# Patient Record
Sex: Male | Born: 1960 | Race: Black or African American | Hispanic: No | Marital: Married | Smoking: Former smoker
Health system: Southern US, Community
[De-identification: ages and names within clinical notes are randomized; demographics above are authoritative.]

## PROBLEM LIST (undated history)

## (undated) DIAGNOSIS — I10 Essential (primary) hypertension: Secondary | ICD-10-CM

## (undated) DIAGNOSIS — E785 Hyperlipidemia, unspecified: Secondary | ICD-10-CM

## (undated) HISTORY — DX: Essential (primary) hypertension: I10

## (undated) HISTORY — DX: Hyperlipidemia, unspecified: E78.5

---

## 2016-09-28 ENCOUNTER — Encounter: Payer: Self-pay | Admitting: Cardiology

## 2016-09-28 NOTE — Progress Notes (Signed)
Cardiology Office Note  Date: 09/29/2016   ID: Tim CampbellDanny L Macdonald, DOB [birth year], MRN [id number]  PCP: Kirstie PeriSHAH,ASHISH, MD  Referring provider: Orpah Cobbale Quirke, MD [facility])  Consulting Cardiologist: Nona DellSamuel Edra Riccardi, MD   Chief Complaint  Patient presents with  . Chest discomfort    History of Present Illness: Tim Macdonald is a 55 y.o. male referred for cardiology consultation after [facility]. He is a truck driver, [place], felt a feeling of jitteriness and and unease, also a streak-like tightness across his lower chest causing him to seek evaluation. Records indicate normal troponin I and d-dimer levels in the ER, no acute findings by ECG or chest x-ray. Some PVCs were noted on monitoring, otherwise no sustained arrhythmias. He was placed on PPI and told to follow-up with a cardiologist. He lives in [place].  He reports frequent reflux symptoms. Denies any regular use of energy drinks or other stimulants. He drinks coffee in the morning, had some before his usual trip the other day. Sometimes he feels a chest tightness at nighttime and a bitter taste in his throat.  Past medical history is outlined below. He has not undergone any prior ischemic testing. He states that he follows for regular DOT physicals.  Past Medical History:  Diagnosis Date  . Essential hypertension   . Hyperlipidemia     History reviewed. No pertinent surgical history.  Current Outpatient Prescriptions  Medication Sig Dispense Refill  . amLODipine (NORVASC) 10 MG tablet Take 10 mg by mouth daily.    Marland Kitchen. aspirin 81 MG chewable tablet Chew 81 mg by mouth daily.    Marland Kitchen. omeprazole (PRILOSEC) 20 MG capsule Take 20 mg by mouth daily.    . simvastatin (ZOCOR) 40 MG tablet Take 40 mg by mouth daily. Pt only taking 20 mg due to leg cramps    . terbinafine (LAMISIL) 250 MG tablet Take 250 mg by mouth daily.     No current facility-administered medications for this  visit.    Allergies:  Patient has no known allergies.   Social History: The patient  reports that he has been smoking E-cigarettes.  He started smoking about 32 years ago. He has never used smokeless tobacco. He reports that he does not drink alcohol or use drugs.   Family History: The patient's family history includes Heart disease in his father; Hypertension in his mother.   ROS:  Please see the history of present illness. Otherwise, complete review of systems is positive for none.  All other systems are reviewed and negative.   Physical Exam: VS:  BP 110/72 (BP Location: Right Arm)   Pulse 86   Ht 5\' 10"  (1.778 m)   Wt 260 lb (117.9 kg)   SpO2 96%   BMI 37.31 kg/m , BMI Body mass index is 37.31 kg/m.  Wt Readings from Last 3 Encounters:  09/29/16 260 lb (117.9 kg)    General: Obese male, appears comfortable at rest. HEENT: Conjunctiva and lids normal, oropharynx clear. Neck: Supple, no elevated JVP or carotid bruits, no thyromegaly. Lungs: Clear to auscultation, nonlabored breathing at rest. Cardiac: Regular rate and rhythm, no S3 or significant systolic murmur, no pericardial rub. Abdomen: Obese, nontender, bowel sounds present, no guarding or rebound. Extremities: Trace ankle edema, distal pulses 2+. Skin: Warm and dry. Musculoskeletal: No kyphosis. Neuropsychiatric: Alert and oriented x3, affect grossly appropriate.  Recent Labwork:  November 2017: Potassium 4.1, BUN 14, creatinine 1.3, AST 31, ALT  38, troponin I less than 0.012, d-dimer 0.38, hemoglobin 14.4, platelets 215  Assessment and Plan:  491. 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 Also reporting recurrent reflux symptoms. ECG nonspecific. He has not undergone any ischemic testing. We will arrange an exercise Myoview for further cardiac assessment  prior to clearing him to return to the road. Otherwise continue PPI.  2. Reflux symptoms, now on Prilosec. Keep follow-up with Dr. Sherryll BurgerShah.  3. Essential hypertension, on Norvasc, blood pressure well controlled today.  4. Hyperlipidemia, on Zocor.  Current medicines were reviewed with the patient today.   Orders Placed This Encounter  Procedures  . NM Myocar Multi W/Spect W/Wall Motion / EF    Disposition: Call with test results.  Signed, Jonelle SidleSamuel G. Abelino Tippin, MD, [facility] 09/29/2016 8:58 AM    [facility] at [facility] [address], [place], [place] [address] Phone: [phone]; Fax: [phone]

## 2016-09-29 ENCOUNTER — Ambulatory Visit (INDEPENDENT_AMBULATORY_CARE_PROVIDER_SITE_OTHER): Payer: 59 | Admitting: Cardiology

## 2016-09-29 ENCOUNTER — Encounter: Payer: Self-pay | Admitting: Cardiology

## 2016-09-29 VITALS — BP 110/72 | HR 86 | Ht 70.0 in | Wt 260.0 lb

## 2016-09-29 DIAGNOSIS — I10 Essential (primary) hypertension: Secondary | ICD-10-CM | POA: Diagnosis not present

## 2016-09-29 DIAGNOSIS — R0789 Other chest pain: Secondary | ICD-10-CM | POA: Diagnosis not present

## 2016-09-29 DIAGNOSIS — R072 Precordial pain: Secondary | ICD-10-CM

## 2016-09-29 DIAGNOSIS — K219 Gastro-esophageal reflux disease without esophagitis: Secondary | ICD-10-CM | POA: Diagnosis not present

## 2016-09-29 DIAGNOSIS — E782 Mixed hyperlipidemia: Secondary | ICD-10-CM

## 2016-09-29 NOTE — Patient Instructions (Signed)
Your physician recommends that you schedule a follow-up appointment in:  To be determined     Your physician has requested that you have en exercise stress myoview. For further information please visit [email or web address]. Please follow instruction sheet, as given.      Thank you for choosing [facility] !

## 2016-09-30 ENCOUNTER — Encounter (HOSPITAL_COMMUNITY): Payer: Self-pay

## 2016-09-30 ENCOUNTER — Inpatient Hospital Stay (HOSPITAL_COMMUNITY): Admission: RE | Admit: 2016-09-30 | Payer: 59 | Source: Ambulatory Visit

## 2016-09-30 ENCOUNTER — Encounter (HOSPITAL_COMMUNITY)
Admission: RE | Admit: 2016-09-30 | Discharge: 2016-09-30 | Disposition: A | Discharge status: Home / Self Care | Payer: 59 | Source: Ambulatory Visit | Attending: Cardiology | Admitting: Cardiology

## 2016-09-30 ENCOUNTER — Encounter (HOSPITAL_BASED_OUTPATIENT_CLINIC_OR_DEPARTMENT_OTHER)
Admission: RE | Admit: 2016-09-30 | Discharge: 2016-09-30 | Disposition: A | Discharge status: Home / Self Care | Payer: 59 | Source: Ambulatory Visit | Attending: Cardiology | Admitting: Cardiology

## 2016-09-30 ENCOUNTER — Ambulatory Visit (HOSPITAL_COMMUNITY)
Admission: RE | Admit: 2016-09-30 | Discharge: 2016-09-30 | Disposition: A | Discharge status: Home / Self Care | Payer: 59 | Source: Ambulatory Visit | Attending: Cardiology | Admitting: Cardiology

## 2016-09-30 DIAGNOSIS — R072 Precordial pain: Secondary | ICD-10-CM

## 2016-09-30 LAB — NM MYOCAR MULTI W/SPECT W/WALL MOTION / EF
CHL CUP NUCLEAR SRS: 5
CHL CUP NUCLEAR SSS: 6
CHL CUP RESTING HR STRESS: 75 {beats}/min
CHL RATE OF PERCEIVED EXERTION: 13
CSEPED: 6 min
CSEPEW: 7 METS
Exercise duration (sec): 0 s
LV dias vol: 64 mL (ref 62–150)
LV sys vol: 25 mL
MPHR: 167 {beats}/min
Peak HR: 155 {beats}/min
Percent HR: 92 %
RATE: 0.3
SDS: 1
TID: 0.79

## 2016-09-30 MED ORDER — SODIUM CHLORIDE 0.9% FLUSH
INTRAVENOUS | Status: AC
  Administered 2016-09-30: 10 mL via INTRAVENOUS
  Filled 2016-09-30: qty 10

## 2016-09-30 MED ORDER — REGADENOSON 0.4 MG/5ML IV SOLN
INTRAVENOUS | Status: AC
  Filled 2016-09-30: qty 5

## 2016-09-30 MED ORDER — TECHNETIUM TC 99M TETROFOSMIN IV KIT
30.0000 | PACK | Freq: Once | INTRAVENOUS | Status: AC | PRN
  Administered 2016-09-30: 30 via INTRAVENOUS

## 2016-09-30 MED ORDER — TECHNETIUM TC 99M TETROFOSMIN IV KIT
10.0000 | PACK | Freq: Once | INTRAVENOUS | Status: AC | PRN
  Administered 2016-09-30: 10.1 via INTRAVENOUS

## 2016-10-04 ENCOUNTER — Telehealth: Payer: Self-pay

## 2016-10-04 NOTE — Telephone Encounter (Signed)
-----   Message from Jonelle SidleSamuel G McDowell, MD sent at 09/30/2016  4:42 PM EST ----- Results reviewed. Stress test was low risk without ischemia. No further cardiac workup anticipated at this time. 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 Keep follow-up with Dr. Sherryll BurgerShah. A copy of this test should be forwarded to [facility], MD.

## 2016-10-04 NOTE — Telephone Encounter (Signed)
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

## 2020-10-14 ENCOUNTER — Encounter: Payer: Self-pay | Admitting: Internal Medicine

## 2020-10-14 ENCOUNTER — Other Ambulatory Visit: Payer: Self-pay

## 2020-10-14 ENCOUNTER — Ambulatory Visit (HOSPITAL_COMMUNITY)
Admission: RE | Admit: 2020-10-14 | Discharge: 2020-10-14 | Disposition: A | Discharge status: Home / Self Care | Payer: BC Managed Care – PPO | Source: Ambulatory Visit | Attending: Internal Medicine | Admitting: Internal Medicine

## 2020-10-14 ENCOUNTER — Ambulatory Visit (INDEPENDENT_AMBULATORY_CARE_PROVIDER_SITE_OTHER): Payer: BC Managed Care – PPO | Admitting: Internal Medicine

## 2020-10-14 ENCOUNTER — Other Ambulatory Visit (HOSPITAL_COMMUNITY)
Admission: RE | Admit: 2020-10-14 | Discharge: 2020-10-14 | Disposition: A | Discharge status: Home / Self Care | Payer: BC Managed Care – PPO | Source: Ambulatory Visit | Attending: Internal Medicine | Admitting: Internal Medicine

## 2020-10-14 DIAGNOSIS — I10 Essential (primary) hypertension: Secondary | ICD-10-CM | POA: Insufficient documentation

## 2020-10-14 DIAGNOSIS — J8410 Pulmonary fibrosis, unspecified: Secondary | ICD-10-CM | POA: Insufficient documentation

## 2020-10-14 DIAGNOSIS — R0609 Other forms of dyspnea: Secondary | ICD-10-CM

## 2020-10-14 DIAGNOSIS — J984 Other disorders of lung: Secondary | ICD-10-CM | POA: Insufficient documentation

## 2020-10-14 DIAGNOSIS — R0600 Dyspnea, unspecified: Secondary | ICD-10-CM | POA: Insufficient documentation

## 2020-10-14 LAB — CBC WITH DIFFERENTIAL/PLATELET
Abs Immature Granulocytes: 0.02 10*3/uL (ref 0.00–0.07)
Basophils Absolute: 0.1 10*3/uL (ref 0.0–0.1)
Basophils Relative: 1 %
Eosinophils Absolute: 0.3 10*3/uL (ref 0.0–0.5)
Eosinophils Relative: 5 %
HCT: 47.1 % (ref 39.0–52.0)
Hemoglobin: 14.9 g/dL (ref 13.0–17.0)
Immature Granulocytes: 0 %
Lymphocytes Relative: 35 %
Lymphs Abs: 2 10*3/uL (ref 0.7–4.0)
MCH: 27 pg (ref 26.0–34.0)
MCHC: 31.6 g/dL (ref 30.0–36.0)
MCV: 85.3 fL (ref 80.0–100.0)
Monocytes Absolute: 0.5 10*3/uL (ref 0.1–1.0)
Monocytes Relative: 9 %
Neutro Abs: 2.9 10*3/uL (ref 1.7–7.7)
Neutrophils Relative %: 50 %
Platelets: 174 10*3/uL (ref 150–400)
RBC: 5.52 MIL/uL (ref 4.22–5.81)
RDW: 13.5 % (ref 11.5–15.5)
WBC: 5.8 10*3/uL (ref 4.0–10.5)
nRBC: 0 % (ref 0.0–0.2)

## 2020-10-14 LAB — BASIC METABOLIC PANEL
Anion gap: 9 (ref 5–15)
BUN: 14 mg/dL (ref 6–20)
CO2: 27 mmol/L (ref 22–32)
Calcium: 9.4 mg/dL (ref 8.9–10.3)
Chloride: 100 mmol/L (ref 98–111)
Creatinine, Ser: 1.31 mg/dL — ABNORMAL HIGH (ref 0.61–1.24)
GFR, Estimated: >60 mL/min (ref >60)
Glucose, Bld: 104 mg/dL — ABNORMAL HIGH (ref 70–99)
Potassium: 3.8 mmol/L (ref 3.5–5.1)
Sodium: 136 mmol/L (ref 135–145)

## 2020-10-14 LAB — BRAIN NATRIURETIC PEPTIDE: B Natriuretic Peptide: 35 pg/mL (ref 0.0–100.0)

## 2020-10-14 LAB — SEDIMENTATION RATE: Sed Rate: 12 mm/hr (ref 0–16)

## 2020-10-14 LAB — D-DIMER, QUANTITATIVE: D-Dimer, Quant: 1.06 ug/mL-FEU — ABNORMAL HIGH (ref 0.00–0.50)

## 2020-10-14 LAB — TSH: TSH: 1.274 u[IU]/mL (ref 0.350–4.500)

## 2020-10-14 LAB — CK: Total CK: 138 U/L (ref 49–397)

## 2020-10-14 MED ORDER — VALSARTAN 80 MG PO TABS: 80.0000 mg | ORAL_TABLET | Freq: Every day | ORAL | 11 refills | Status: DC

## 2020-10-14 NOTE — Assessment & Plan Note (Addendum)
Try off acei 10/14/2020 for atypical sob with dry cough persistent since covid 05/2020   In the best review of chronic cough to date ( NEJM 2016 375 [id number]) ,  ACEi are now felt to cause cough in up to  20% of pts which is a 4 fold increase from previous reports and does not include the variety of non-specific complaints we see in pulmonary clinic in pts on ACEi but previously attributed to another dx like  Copd/asthma and  include PNDS, throat and chest congestion, "bronchitis", unexplained dyspnea and noct "strangling" sensations, and hoarseness, but also  atypical /refractory GERD symptoms like dysphagia and "bad heartburn"   The only way I know  to prove this is not an "ACEi Case" is a trial off ACEi x a minimum of 6 weeks then regroup.  >>> try diovan 80 mg daily and f/u in 6 weeks          Each maintenance medication was reviewed in detail including emphasizing most importantly the difference between maintenance and prns and under what circumstances the prns are to be triggered using an action plan format where appropriate.  Total time for H and P, chart review, counseling,   directly observing portions of ambulatory 02 saturation study/  and generating customized AVS unique to this office visit / charting = 49 min

## 2020-10-14 NOTE — Progress Notes (Signed)
Tim Macdonald, male    DOB: 1960-12-28      MRN: [id number]   Brief patient profile:  59 yobm quit smoking 05/2020 with onset of covid with background of hbp, high cholesterol and gerd but nl ex tolerance then admitted to [facility] 8/23-31/21 and able to walk 50 ft and sob and has not changed a bit since then so referred to pulmonary clinic in [facility]  10/14/2020 by Dr  Tim Macdonald.    Cardiac w/u in [place] "everything was fine"  History of Present Illness  10/14/2020  Pulmonary/ 1st office eval/ Tim Macdonald / [facility] Office  Chief Complaint  Patient presents with  . 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   Sleeps as above without nocturnal  or early am exacerbation  of respiratory  c/o's or need for noct saba. Also denies any obvious fluctuation of symptoms with weather or environmental changes or other aggravating or alleviating factors except as outlined above   No unusual exposure hx or h/o childhood pna/ asthma or knowledge of premature birth.  Current Allergies, Complete Past Medical History, Past Surgical History, Family History, and Social History were reviewed in Tim Macdonald record.  ROS  The following are not active complaints unless bolded Hoarseness, sore throat, dysphagia, dental problems, itching, sneezing,  nasal congestion or discharge of excess mucus or purulent secretions, ear ache,   fever, chills, sweats, unintended wt loss or wt gain, classically pleuritic or exertional cp,  orthopnea pnd or arm/hand swelling  or  leg swelling, presyncope, palpitations, abdominal pain, anorexia, nausea, vomiting, diarrhea  or change in bowel habits or change in bladder habits, change in stools or change in urine, dysuria, hematuria,  rash, arthralgias myalgias, visual complaints, headache, numbness, weakness or ataxia or problems with walking or coordination,  change in mood or  memory.           Past Medical History:  Diagnosis Date  . Essential hypertension   . Hyperlipidemia     Outpatient Medications Prior to Visit  Medication Sig Dispense Refill  . amLODipine (NORVASC) 10 MG tablet Take 10 mg by mouth daily.    Marland Kitchen aspirin 81 MG chewable tablet Chew 81 mg by mouth daily.    Marland Kitchen lisinopril (ZESTRIL) 2.5 MG tablet Take 2.5 mg by mouth daily.    . simvastatin (ZOCOR) 40 MG tablet Take 40 mg by mouth daily. Pt only taking 20 mg due to leg cramps    . omeprazole (PRILOSEC) 20 MG capsule Take 20 mg by mouth daily.    Marland Kitchen terbinafine (LAMISIL) 250 MG tablet Take 250 mg by mouth daily.     No facility-administered medications prior to visit.     Objective:     BP 128/90 (BP Location: Left Arm, Cuff Size: Normal)   Pulse (!) 59   Temp (!) 97.2 F (36.2 C) (Temporal)   Ht 5\' 10"  (1.778 m)   Wt 245 lb (111.1 kg)   SpO2 96% Comment: on RA  BMI 35.15  kg/m   SpO2: 96 % (on RA)   Soft spoken pleasant amb bm nad    HEENT : pt wearing mask not removed for exam due to covid -19 concerns.    NECK :  without JVD/Nodes/TM/ nl carotid upstrokes bilaterally   LUNGS: no acc muscle use,  Nl contour chest which is clear to A and P bilaterally without cough on insp or exp maneuvers   CV:  RRR  no s3 or murmur or increase in P2, and no edema   ABD:  soft and nontender with nl inspiratory excursion in the supine position. No bruits or organomegaly appreciated, bowel sounds nl  MS:  Nl gait/ ext warm without deformities, calf tenderness, cyanosis or clubbing No obvious joint restrictions   SKIN: warm and dry without  lesions    NEURO:  alert, approp, nl sensorium with  no motor or cerebellar deficits apparent.     I personally reviewed images and agree with radiology impression as follows:   Chest CT  1. Spectrum of findings compatible with basilar predominant fibrotic  interstitial lung disease without frank honeycombing. Compared to  06/23/2020 chest CT study, the consolidative opacities have  resolved, the ground-glass opacities have improved, and the  reticulation and traction bronchiectasis is worsened. 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 Findings  are suggestive of an alternative diagnosis (not UIP) per consensus  guidelines: Diagnosis of Idiopathic Pulmonary Fibrosis: An Official  ATS/ERS/JRS/ALAT Clinical Practice Guideline. Am Rosezetta Schlatter Crit Care  Med Vol 198, Iss 5, [id number], Jul 01 2017.  2. Stable nonspecific mild mediastinal and right hilar  lymphadenopathy, most likely representing benign reactive  adenopathy.  3. Moderate emphysema with mild diffuse bronchial wall thickening,  suggesting COPD.  4. Emphysema (ICD10-J43.9).    CXR PA and Lateral:   10/14/2020 :    I personally reviewed images and   impression as follows:  Minimal increase markings non-specific  Labs ordered/ reviewed:      Chemistry      Component Value Date/Time   NA 136 10/14/2020 1107   K 3.8 10/14/2020 1107   CL 100 10/14/2020 1107   CO2 27 10/14/2020 1107   BUN 14 10/14/2020 1107   CREATININE 1.31 (H) 10/14/2020 1107      Component Value Date/Time   CALCIUM 9.4 10/14/2020 1107        Lab Results  Component Value Date   WBC 5.8 10/14/2020   HGB 14.9 10/14/2020   HCT 47.1 10/14/2020   MCV 85.3 10/14/2020   PLT 174 10/14/2020      EOS                                                                0.3                                     10/14/2020   Lab Results  Component Value Date   DDIMER 1.06 (H) 10/14/2020      Lab Results  Component Value Date    TSH 1.274 10/14/2020     BNP   10/14/2020     =    35 CPK   10/14/2020     =  138    Lab Results  Component Value Date   ESRSEDRATE 12 10/14/2020                Assessment   No problem-specific Assessment & Plan notes found for this encounter.     Sandrea Hughs, MD 10/14/2020

## 2020-10-14 NOTE — Assessment & Plan Note (Signed)
Onset with covid infection 05/2020 req admit/ transient 02 -  10/14/2020   Walked RA  approx   300 ft  @ avg pace  stopped due to  [employer] with sats 99% and aches/weakness in legs on zocor 40 mg daily   - d/c acei 10/14/2020    Symptoms are markedly disproportionate to objective findings and not clear to what extent this is actually a pulmonary  problem but pt does appear to have difficult to sort out respiratory symptoms of unknown origin for which  DDX  = almost all start with A and  include Adherence, Ace Inhibitors, Acid Reflux, Active Sinus Disease, Alpha 1 Antitripsin deficiency, Anxiety masquerading as Airways dz,  ABPA,  Allergy(esp in young), Aspiration (esp in elderly), Adverse effects of meds,  Active smoking or Vaping, A bunch of PE's/clot burden (a few small clots can't cause this syndrome unless there is already severe underlying pulm or vascular dz with poor reserve),  Anemia or thyroid disorder, plus two Bs  = Bronchiectasis and Beta blocker use..and one C= CHF     Adherence is always the initial "prime suspect" and is a multilayered concern that requires a "trust but verify" approach in every patient - starting with knowing how to use medications, especially inhalers, correctly, keeping up with refills and understanding the fundamental difference between maintenance and prns vs those medications only taken for a very short course and then stopped and not refilled.   ACEi adverse effects at the  top of the usual list of suspects and the only way to rule it out is a trial off > see a/p    ? Adverse drug effects ? zocor myopathy > no elevation in CPK so just try one half dose until returns  ? Acid (or non-acid) GERD > always difficult to exclude as up to 75% of pts in some series report no assoc GI/ Heartburn symptoms> rec max (24h)  acid suppression and diet restrictions/ reviewed and instructions given in writing.  ? Asthma/ allergy > eos 0.3 but no other evidence  ? Anemia/ thyroid  disorder > ruled out  ? ? Anxiety/depression/ deconditioning  > usually at the bottom of this list of usual suspects but should be much higher on this pt's  and may interfere with adherence and also interpretation of response or lack thereof to symptom management which can be quite subjective.  >> reconditioning reviewed in detail  ? A bunch of PEs  > D dimer  high normal value (seen commonly in the elderly or chronically ill or pts during nl recuperating from covid)  may miss small peripheral pe, the clot burden with sob is moderately high and the d dimer  has a very high neg pred value if used in this setting.   ? chf > excluded by BNP/ note cards w/u already neg per pt but c/w palpitations should be directed back to them

## 2020-10-14 NOTE — Patient Instructions (Addendum)
We will walk you today to check your 02 sats  Make sure you check your oxygen saturation at your highest level of activity to be sure it stays over 90% and keep track of it at least once a week, more often if breathing getting worse, and let me know if losing ground.   Stop lisinopril and only take one half of your simvastatin daily (zocoor)  Start diovan (valsartan) 80 mg one daily and your breathing and coughing should improve over the next few weeks   To get the most out of exercise, you need to be continuously aware that you are short of breath, but never out of breath, for 30 minutes twice daily. As you improve, it will actually be easier for you to do the same amount of exercise  in  30 minutes so always push to the level where you are short of breath.    Keep taking the taking the medications for reflux until 100% better  GERD (REFLUX)  is an extremely common cause of respiratory symptoms just like yours , many times with no obvious heartburn at all.    It can be treated with medication, but also with lifestyle changes including elevation of the head of your bed (ideally with 6 -8inch blocks under the headboard of your bed),  Smoking cessation, avoidance of late meals, excessive alcohol, and avoid fatty foods, chocolate, peppermint, colas, red wine, and acidic juices such as orange juice.  NO MINT OR MENTHOL PRODUCTS SO NO COUGH DROPS  USE SUGARLESS CANDY INSTEAD (Jolley ranchers or Stover's or Life Savers) or even ice chips will also do - the key is to swallow to prevent all throat clearing. NO OIL BASED VITAMINS - use powdered substitutes.  Avoid fish oil when coughing.      I very strongly recommend you get the moderna or pfizer vaccine as soon as possible based on your risk of dying from the virus  and the proven safety and benefit of these vaccines against even the omicron  variant.  This can save your life as well as  those of your loved ones,  especially if they are also not  vaccinated.    Please remember to go to the lab and x-ray department at [facility]   for your tests - we will call you with the results when they are available.   Please schedule a follow up office visit in 4 weeks, sooner if needed with pfts in meantime

## 2020-10-21 ENCOUNTER — Telehealth: Payer: Self-pay | Admitting: Internal Medicine

## 2020-10-21 NOTE — Telephone Encounter (Signed)
Call patient : Studies are unremarkable, the d dimer is as expected   I have called and LM on VM for pt to call back about these results.

## 2020-10-21 NOTE — Telephone Encounter (Signed)
Pt returning missed call. [phone] 

## 2020-10-21 NOTE — Telephone Encounter (Signed)
I have called and spoke with pt and he is aware of lab results.  appt scheduled with MW in [place] for 11/13/2020.

## 2020-10-21 NOTE — Telephone Encounter (Signed)
Pt returning missed call. [phone]

## 2020-11-07 ENCOUNTER — Other Ambulatory Visit: Payer: Self-pay

## 2020-11-07 ENCOUNTER — Emergency Department (HOSPITAL_COMMUNITY)
Admit: 2020-11-07 | Discharge: 2020-11-07 | Disposition: A | Discharge status: Home / Self Care | Payer: BC Managed Care – PPO | Attending: Emergency Medicine | Admitting: Emergency Medicine

## 2020-11-07 ENCOUNTER — Emergency Department (HOSPITAL_COMMUNITY)
Admission: EM | Admit: 2020-11-07 | Discharge: 2020-11-07 | Disposition: A | Discharge status: Home / Self Care | Payer: BC Managed Care – PPO | Attending: Emergency Medicine | Admitting: Emergency Medicine

## 2020-11-07 ENCOUNTER — Emergency Department (HOSPITAL_COMMUNITY): Payer: BC Managed Care – PPO

## 2020-11-07 ENCOUNTER — Encounter (HOSPITAL_COMMUNITY): Payer: Self-pay | Admitting: Emergency Medicine

## 2020-11-07 DIAGNOSIS — I10 Essential (primary) hypertension: Secondary | ICD-10-CM | POA: Diagnosis not present

## 2020-11-07 DIAGNOSIS — Z87891 Personal history of nicotine dependence: Secondary | ICD-10-CM | POA: Diagnosis not present

## 2020-11-07 DIAGNOSIS — R202 Paresthesia of skin: Secondary | ICD-10-CM | POA: Diagnosis present

## 2020-11-07 DIAGNOSIS — Z79899 Other long term (current) drug therapy: Secondary | ICD-10-CM | POA: Diagnosis not present

## 2020-11-07 DIAGNOSIS — Z7982 Long term (current) use of aspirin: Secondary | ICD-10-CM | POA: Diagnosis not present

## 2020-11-07 DIAGNOSIS — Z20822 Contact with and (suspected) exposure to covid-19: Secondary | ICD-10-CM | POA: Insufficient documentation

## 2020-11-07 DIAGNOSIS — R200 Anesthesia of skin: Secondary | ICD-10-CM

## 2020-11-07 LAB — CBC WITH DIFFERENTIAL/PLATELET
Abs Immature Granulocytes: 0.01 10*3/uL (ref 0.00–0.07)
Basophils Absolute: 0.1 10*3/uL (ref 0.0–0.1)
Basophils Relative: 2 %
Eosinophils Absolute: 0.3 10*3/uL (ref 0.0–0.5)
Eosinophils Relative: 8 %
HCT: 45.1 % (ref 39.0–52.0)
Hemoglobin: 14.4 g/dL (ref 13.0–17.0)
Immature Granulocytes: 0 %
Lymphocytes Relative: 43 %
Lymphs Abs: 1.8 10*3/uL (ref 0.7–4.0)
MCH: 27.1 pg (ref 26.0–34.0)
MCHC: 31.9 g/dL (ref 30.0–36.0)
MCV: 84.9 fL (ref 80.0–100.0)
Monocytes Absolute: 0.5 10*3/uL (ref 0.1–1.0)
Monocytes Relative: 13 %
Neutro Abs: 1.4 10*3/uL — ABNORMAL LOW (ref 1.7–7.7)
Neutrophils Relative %: 34 %
Platelets: 190 10*3/uL (ref 150–400)
RBC: 5.31 MIL/uL (ref 4.22–5.81)
RDW: 13.6 % (ref 11.5–15.5)
WBC: 4.1 10*3/uL (ref 4.0–10.5)
nRBC: 0 % (ref 0.0–0.2)

## 2020-11-07 LAB — COMPREHENSIVE METABOLIC PANEL
ALT: 17 U/L (ref 0–44)
AST: 19 U/L (ref 15–41)
Albumin: 4 g/dL (ref 3.5–5.0)
Alkaline Phosphatase: 53 U/L (ref 38–126)
Anion gap: 9 (ref 5–15)
BUN: 13 mg/dL (ref 6–20)
CO2: 27 mmol/L (ref 22–32)
Calcium: 9.1 mg/dL (ref 8.9–10.3)
Chloride: 98 mmol/L (ref 98–111)
Creatinine, Ser: 1.36 mg/dL — ABNORMAL HIGH (ref 0.61–1.24)
GFR, Estimated: >60 mL/min (ref >60)
Glucose, Bld: 103 mg/dL — ABNORMAL HIGH (ref 70–99)
Potassium: 4 mmol/L (ref 3.5–5.1)
Sodium: 134 mmol/L — ABNORMAL LOW (ref 135–145)
Total Bilirubin: 0.9 mg/dL (ref 0.3–1.2)
Total Protein: 7.5 g/dL (ref 6.5–8.1)

## 2020-11-07 LAB — URINALYSIS, ROUTINE W REFLEX MICROSCOPIC
Bilirubin Urine: NEGATIVE
Glucose, UA: NEGATIVE mg/dL
Hgb urine dipstick: NEGATIVE
Ketones, ur: NEGATIVE mg/dL
Leukocytes,Ua: NEGATIVE
Nitrite: NEGATIVE
Protein, ur: NEGATIVE mg/dL
Specific Gravity, Urine: 1.016 (ref 1.005–1.030)
pH: 7 (ref 5.0–8.0)

## 2020-11-07 LAB — RESP PANEL BY RT-PCR (RSV, FLU A&B, COVID)  RVPGX2
Influenza A by PCR: NEGATIVE
Influenza B by PCR: NEGATIVE
Resp Syncytial Virus by PCR: NEGATIVE
SARS Coronavirus 2 by RT PCR: NEGATIVE

## 2020-11-07 MED ORDER — GADOBUTROL 1 MMOL/ML IV SOLN
10.0000 mL | Freq: Once | INTRAVENOUS | Status: AC | PRN
  Administered 2020-11-07: 10 mL via INTRAVENOUS

## 2020-11-07 NOTE — ED Notes (Signed)
Report giving to [facility]

## 2020-11-07 NOTE — ED Notes (Signed)
Patient transported to MRI 

## 2020-11-07 NOTE — ED Provider Notes (Signed)
[facility] EMERGENCY DEPARTMENT Provider Note   CSN: [id number] Arrival date & time: 11/07/20  7902     History Chief Complaint  Patient presents with  . Numbness    Tim Macdonald is a 60 y.o. male.  Pt presents to the ED today with numbness to the right arm which he noticed upon waking today at 0700.  Pt denies any difficulty speaking, swallowing, or any weakness.  He has had some neck pain.  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  Pt had his first Moderna Covid vaccine 2 weeks ago.  No f/c.  No sob.  He is hypertensive, but did not take his morning meds.        Past Medical History:  Diagnosis Date  . Essential hypertension   . Hyperlipidemia     Patient Active Problem List   Diagnosis Date Noted  . DOE (dyspnea on exertion) 10/14/2020  . Essential hypertension 10/14/2020    History reviewed. No pertinent surgical history.     Family History  Problem Relation Age of Onset  . Hypertension Mother   . Heart disease Father     Social History   Tobacco Use  . Smoking status: Former Smoker    Packs/day: 0.25    Years: 36.00    Pack years: 9.00    Types: E-cigarettes, Cigarettes    Quit date: 05/31/2020    Years since quitting: 0.4  . Smokeless tobacco: Never Used  Vaping Use  . Vaping Use: Former  . Quit date: 05/31/2020  Substance Use Topics  . Alcohol use: No  . Drug use: No    Home Medications Prior to Admission medications   Medication Sig Start Date End Date Taking? Authorizing Provider  amLODipine (NORVASC) 10 MG tablet Take 10 mg by mouth daily.    [provider]  aspirin 81 MG chewable tablet Chew 81 mg by mouth daily.    [provider]  simvastatin (ZOCOR) 40 MG tablet Take 40 mg by mouth daily. Pt only taking 20 mg due to leg cramps    [provider]  valsartan (DIOVAN) 80 MG tablet Take 1 tablet (80 mg total) by mouth daily. 10/14/20   Nyoka Cowden, MD    Allergies    Patient has no known  allergies.  Review of Systems   Review of Systems  Neurological:       Numbness right arm  All other systems reviewed and are negative.   Physical Exam Updated Vital Signs BP (!) 152/108   Pulse 88   Temp 98 F (36.7 C) (Oral)   Resp 18   SpO2 98%   Physical Exam Vitals and nursing note reviewed.  Constitutional:      Appearance: Normal appearance.  HENT:     Head: Normocephalic and atraumatic.     Right Ear: External ear normal.     Left Ear: External ear normal.     Nose: Nose normal.     Mouth/Throat:     Mouth: Mucous membranes are moist.     Pharynx: Oropharynx is clear.  Eyes:     Extraocular Movements: Extraocular movements intact.     Conjunctiva/sclera: Conjunctivae normal.     Pupils: Pupils are equal, round, and reactive to light.  Cardiovascular:     Rate and Rhythm: Normal rate and regular rhythm.     Pulses: Normal pulses.     Heart sounds: Normal heart sounds.  Pulmonary:     Effort:  Pulmonary effort is normal.     Breath sounds: Normal breath sounds.  Abdominal:     General: Abdomen is flat. Bowel sounds are normal.     Palpations: Abdomen is soft.  Musculoskeletal:        General: Normal range of motion.     Cervical back: Normal range of motion and neck supple.  Skin:    General: Skin is warm.     Capillary Refill: Capillary refill takes less than 2 seconds.  Neurological:     Mental Status: He is alert and oriented to person, place, and time.     Cranial Nerves: Cranial nerves are intact.     Motor: Motor function is intact.     Coordination: Coordination is intact.     Gait: Gait is intact.     Comments: Paresthesias to right arm.  No weakness.  Psychiatric:        Mood and Affect: Mood normal.        Behavior: Behavior normal.     ED Results / Procedures / Treatments   Labs (all labs ordered are listed, but only abnormal results are displayed) Labs Reviewed  COMPREHENSIVE METABOLIC PANEL - Abnormal; Notable for the following  components:      Result Value   Sodium 134 (*)    Glucose, Bld 103 (*)    Creatinine, Ser 1.36 (*)    All other components within normal limits  CBC WITH DIFFERENTIAL/PLATELET - Abnormal; Notable for the following components:   Neutro Abs 1.4 (*)    All other components within normal limits  RESP PANEL BY RT-PCR (RSV, FLU A&B, COVID)  RVPGX2  URINALYSIS, ROUTINE W REFLEX MICROSCOPIC    EKG EKG Interpretation  Date/Time:  Saturday November 07 2020 08:10:17 EST Ventricular Rate:  92 PR Interval:    QRS Duration: 83 QT Interval:  363 QTC Calculation: 449 R Axis:   66 Text Interpretation: Sinus rhythm Multiple ventricular premature complexes No old tracing to compare Confirmed by Jacalyn Lefevre [phone]) on 11/07/2020 8:14:42 AM   Radiology CT Head Wo Contrast  Result Date: 11/07/2020 CLINICAL DATA:  RIGHT-sided numbness this morning. EXAM: CT HEAD WITHOUT CONTRAST CT CERVICAL SPINE WITHOUT CONTRAST TECHNIQUE: Multidetector CT imaging of the head and cervical spine was performed following the standard protocol without intravenous contrast. Multiplanar CT image reconstructions of the cervical spine were also generated. COMPARISON:  None. FINDINGS: CT HEAD FINDINGS Brain: Ventricles are within normal limits in size and configuration. Patchy hypodense foci within the bilateral periventricular and subcortical white matter regions, most prominent within the LEFT frontal lobe white matter, most suggestive of chronic small vessel ischemic change. No parenchymal mass or mass effect. No parenchymal or extra-axial hemorrhage. Vascular: No hyperdense vessel or unexpected calcification. Skull: Normal. Negative for fracture or focal lesion. Sinuses/Orbits: Fairly extensive mucosal thickening throughout the ethmoid air cells with extension to the upper RIGHT maxillary sinus and lower RIGHT frontal sinus. Orbital and periorbital soft tissues are unremarkable. Other: None. CT CERVICAL SPINE FINDINGS Alignment:  Slight reversal of the normal cervical spine lordosis. No evidence of acute vertebral body subluxation. Skull base and vertebrae: No acute appearing fracture line or displaced fracture fragment. Facet joints appear normally aligned. Soft tissues and spinal canal: No prevertebral fluid or swelling. No visible canal hematoma. Disc levels: Mild degenerative spondylosis within the mid and lower cervical spine, but no more than mild central canal stenosis or neural foramen encroachment at any level. Upper chest: Negative. Other: Bilateral carotid atherosclerosis. IMPRESSION:  1. No acute appearing intracranial abnormality. No intracranial mass or hemorrhage. No skull fracture. 2. Nonspecific hypodensities within the bilateral periventricular and subcortical white matter regions, presumed chronic small vessel ischemic changes although would be advanced for age. Consider MRI for further characterization. 3. No fracture or acute subluxation within the cervical spine. Slight reversal of the normal cervical spine lordosis is likely related to patient positioning or muscle spasm. 4. Mild degenerative change within the mid and lower cervical spine, but no more than mild central canal stenosis or neural foramen encroachment at any level. 5. Carotid atherosclerosis. Electronically Signed   By: Bary RichardStan  Maynard M.D.   On: 11/07/2020 09:09   CT Cervical Spine Wo Contrast  Result Date: 11/07/2020 CLINICAL DATA:  RIGHT-sided numbness this morning. EXAM: CT HEAD WITHOUT CONTRAST CT CERVICAL SPINE WITHOUT CONTRAST TECHNIQUE: Multidetector CT imaging of the head and cervical spine was performed following the standard protocol without intravenous contrast. Multiplanar CT image reconstructions of the cervical spine were also generated. COMPARISON:  None. FINDINGS: CT HEAD FINDINGS Brain: Ventricles are within normal limits in size and configuration. Patchy hypodense foci within the bilateral periventricular and subcortical white matter  regions, most prominent within the LEFT frontal lobe white matter, most suggestive of chronic small vessel ischemic change. No parenchymal mass or mass effect. No parenchymal or extra-axial hemorrhage. Vascular: No hyperdense vessel or unexpected calcification. Skull: Normal. Negative for fracture or focal lesion. Sinuses/Orbits: Fairly extensive mucosal thickening throughout the ethmoid air cells with extension to the upper RIGHT maxillary sinus and lower RIGHT frontal sinus. Orbital and periorbital soft tissues are unremarkable. Other: None. CT CERVICAL SPINE FINDINGS Alignment: Slight reversal of the normal cervical spine lordosis. No evidence of acute vertebral body subluxation. Skull base and vertebrae: No acute appearing fracture line or displaced fracture fragment. Facet joints appear normally aligned. Soft tissues and spinal canal: No prevertebral fluid or swelling. No visible canal hematoma. Disc levels: Mild degenerative spondylosis within the mid and lower cervical spine, but no more than mild central canal stenosis or neural foramen encroachment at any level. Upper chest: Negative. Other: Bilateral carotid atherosclerosis. IMPRESSION: 1. No acute appearing intracranial abnormality. No intracranial mass or hemorrhage. No skull fracture. 2. Nonspecific hypodensities within the bilateral periventricular and subcortical white matter regions, presumed chronic small vessel ischemic changes although would be advanced for age. Consider MRI for further characterization. 3. No fracture or acute subluxation within the cervical spine. Slight reversal of the normal cervical spine lordosis is likely related to patient positioning or muscle spasm. 4. Mild degenerative change within the mid and lower cervical spine, but no more than mild central canal stenosis or neural foramen encroachment at any level. 5. Carotid atherosclerosis. Electronically Signed   By: Bary RichardStan  Maynard M.D.   On: 11/07/2020 09:09     Procedures Procedures (including critical care time)  Medications Ordered in ED Medications - No data to display  ED Course  I have reviewed the triage vital signs and the nursing notes.  Pertinent labs & imaging results that were available during my care of the patient were reviewed by me and considered in my medical decision making (see chart for details).    MDM Rules/Calculators/A&P                          Pt's CT head shows some nonspecific densities and MRI recommended.  MRI unavailable here at [facility].  [facility]  [facility].  MRI head and cervical spine ordered.  BP is elevated, but will do permissive htn in case MRI is positive for CVA.  Final Clinical Impression(s) / ED Diagnoses Final diagnoses:  Numbness  Paresthesias  Primary hypertension    Rx / DC Orders ED Discharge Orders    None       Jacalyn Lefevre, MD 11/07/20 1016

## 2020-11-07 NOTE — ED Provider Notes (Signed)
12:43 PM Patient transferred from [facility] for MRI of the head and neck to further evaluate for causes of right arm numbness today.  He reports he woke up at 7 AM with the symptoms.  He does say they have started to improve now and he denies weakness.  Denies any other neurologic deficits including no facial droop, leg symptoms, speech difficulties, or vision changes.  We will wait on MRI and then discuss the results after.  COVID and flu test are negative.  4:20 PM Patient continues to report his symptoms have resolved.  His MRI of the head and neck showed no stroke, mass, or bleeding.  Some chronic changes are seen.  Patient was informed of these findings and given his improved symptoms and reassuring imaging, feel he is now safe for discharge home.  We will give him instructions to follow-up with PCP as well as number for outpatient neurology.  If any symptoms change or worsen, patient knows to return.  Clinical Impression: 1. Numbness   2. Paresthesias   3. Primary hypertension     Disposition: Discharge  Condition: Good  I have discussed the results, Dx and Tx plan with the pt(& family if present). He/she/they expressed understanding and agree(s) with the plan. Discharge instructions discussed at great length. Strict return precautions discussed and pt &/or family have verbalized understanding of the instructions. No further questions at time of discharge.    New Prescriptions   No medications on file    Follow Up: [facility] [address] [place] [address] [phone]       Icy Fuhrmann, Canary Brim, MD 11/07/20 [phone]

## 2020-11-07 NOTE — ED Triage Notes (Addendum)
Patient c/o right numbness that he noticed this morning upon waking at 7am. Per patient went to sleep at 9:30PM. Denies any weakness on one side, facial drooping, slurred speech, or headache. No deficits noted. Patient states numbness worse with certain position changes. Per patient got Covid vaccine x2 weeks ago. Patient states he has had some neck pain x1 month that he had mentioned to PCP. Patient states pain worse with turning head to the right.

## 2020-11-07 NOTE — Discharge Instructions (Addendum)
Your MRI today did not show evidence of acute stroke, tumor, or bleeding.  It did show some chronic ischemic changes likely related to your high blood pressure.  Please continue following with your PCP for further blood pressure management and please rest and stay hydrated.  If any symptoms change or worsen acutely, please return to the nearest emergency department.

## 2020-11-13 ENCOUNTER — Ambulatory Visit: Payer: Self-pay | Admitting: Internal Medicine

## 2020-11-13 ENCOUNTER — Ambulatory Visit (INDEPENDENT_AMBULATORY_CARE_PROVIDER_SITE_OTHER): Payer: BC Managed Care – PPO | Admitting: Pulmonary Disease

## 2020-11-13 ENCOUNTER — Encounter: Payer: Self-pay | Admitting: Pulmonary Disease

## 2020-11-13 VITALS — BP 132/82 | HR 99 | Temp 98.6°F | Ht 70.0 in | Wt 245.0 lb

## 2020-11-13 DIAGNOSIS — R0609 Other forms of dyspnea: Secondary | ICD-10-CM

## 2020-11-13 DIAGNOSIS — R0600 Dyspnea, unspecified: Secondary | ICD-10-CM | POA: Diagnosis not present

## 2020-11-13 NOTE — Progress Notes (Signed)
   Subjective:    Patient ID: Tim Macdonald, male    DOB: [birth year], 60 y.o.   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  10/14/2020 by Dr  Sherryll Burger.    Cardiac w/u in [facility] "everything was fine"  Chief Complaint  Patient presents with  . Follow-up    Breathing is unchanged since the last visit. He is using his albuterol inhaler 2-3 x per day.     On his initial visit with my partner Dr. Sherene Sires, ACE inhibitor were stopped and he was placed on Diovan.  No desaturation was noted on ambulation.  Labs showed normal TSH, normal CK, normal BNP, no leukocytosis or anemia, creatinine 1.31 Chest x-ray 10/14/2020 independently reviewed showed minimal scarring at the left base  He had an ED visit 1/8 for sudden onset right arm numbness , COVID and flu test was negative MRI C-spine showed mild multilevel spondylosis with moderate left neuroforaminal stenosis at C3/4 MRI brain showed chronic microvascular changes He has been referred to neurology  He complains of persistent shortness of breath.  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 Also denies presyncope, palpitations, heartburn, abdominal pain, nausea, vomiting, diarrhea or change  in bowel or urinary habits, dysuria,hematuria, rash, arthralgias, visual complaints, headache, numbness weakness or ataxia.      Objective:   Physical Exam  Gen. Pleasant, obese, in no distress ENT - no lesions, no post nasal drip Neck: No JVD, no thyromegaly, no carotid bruits Lungs: no use of accessory muscles, no dullness to percussion, decreased without rales or rhonchi  Cardiovascular: Rhythm regular, heart sounds  normal, no murmurs or gallops, no peripheral edema Musculoskeletal: No deformities, no cyanosis or clubbing , no tremors       Assessment & Plan:

## 2020-11-13 NOTE — Patient Instructions (Signed)
Schedule PFTs High res CT chest  To look for scarring

## 2020-11-13 NOTE — Assessment & Plan Note (Signed)
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  He seems to have an albuterol MDI and based on PFTs we will likely need LABA/LAMA combination

## 2020-11-16 ENCOUNTER — Telehealth: Payer: Self-pay | Admitting: Internal Medicine

## 2020-11-16 NOTE — Telephone Encounter (Signed)
Pt states he got his covid vaccine at [employer]. He got the [place].

## 2020-11-24 ENCOUNTER — Other Ambulatory Visit (HOSPITAL_COMMUNITY)
Admission: RE | Admit: 2020-11-24 | Discharge: 2020-11-24 | Disposition: A | Discharge status: Home / Self Care | Payer: BC Managed Care – PPO | Source: Ambulatory Visit | Attending: Pulmonary Disease | Admitting: Pulmonary Disease

## 2020-11-24 DIAGNOSIS — Z20822 Contact with and (suspected) exposure to covid-19: Secondary | ICD-10-CM | POA: Insufficient documentation

## 2020-11-24 DIAGNOSIS — Z01812 Encounter for preprocedural laboratory examination: Secondary | ICD-10-CM | POA: Diagnosis present

## 2020-11-24 LAB — SARS CORONAVIRUS 2 (TAT 6-24 HRS): SARS Coronavirus 2: NEGATIVE

## 2020-11-27 ENCOUNTER — Ambulatory Visit (INDEPENDENT_AMBULATORY_CARE_PROVIDER_SITE_OTHER): Payer: BC Managed Care – PPO | Admitting: Pulmonary Disease

## 2020-11-27 ENCOUNTER — Other Ambulatory Visit: Payer: Self-pay

## 2020-11-27 ENCOUNTER — Telehealth: Payer: Self-pay | Admitting: Pulmonary Disease

## 2020-11-27 DIAGNOSIS — R0600 Dyspnea, unspecified: Secondary | ICD-10-CM | POA: Diagnosis not present

## 2020-11-27 DIAGNOSIS — R0609 Other forms of dyspnea: Secondary | ICD-10-CM

## 2020-11-27 LAB — PULMONARY FUNCTION TEST
DL/VA % pred: 113 %
DL/VA: 4.89 ml/min/mmHg/L
DLCO cor % pred: 81 %
DLCO cor: 22.86 ml/min/mmHg
DLCO unc % pred: 80 %
DLCO unc: 22.73 ml/min/mmHg
FEF 25-75 Post: 1.12 L/sec
FEF 25-75 Pre: 2.14 L/sec
FEF2575-%Change-Post: -47 %
FEF2575-%Pred-Post: 36 %
FEF2575-%Pred-Pre: 70 %
FEV1-%Change-Post: -19 %
FEV1-%Pred-Post: 70 %
FEV1-%Pred-Pre: 87 %
FEV1-Post: 2.24 L
FEV1-Pre: 2.79 L
FEV1FVC-%Change-Post: -15 %
FEV1FVC-%Pred-Pre: 95 %
FEV6-%Change-Post: -7 %
FEV6-%Pred-Post: 86 %
FEV6-%Pred-Pre: 93 %
FEV6-Post: 3.41 L
FEV6-Pre: 3.68 L
FEV6FVC-%Change-Post: 0 %
FEV6FVC-%Pred-Post: 103 %
FEV6FVC-%Pred-Pre: 103 %
FVC-%Change-Post: -5 %
FVC-%Pred-Post: 85 %
FVC-%Pred-Pre: 90 %
FVC-Post: 3.49 L
FVC-Pre: 3.69 L
Post FEV1/FVC ratio: 64 %
Post FEV6/FVC ratio: 100 %
Pre FEV1/FVC ratio: 76 %
Pre FEV6/FVC Ratio: 100 %
RV % pred: 89 %
RV: 1.95 L
TLC % pred: 78 %
TLC: 5.51 L

## 2020-11-27 NOTE — Telephone Encounter (Signed)
Spoke with pt, states his wife has been hospitalized Xseveral months and currently has covid.  He has not seen wife in 6 days.  Pt had negative PCR test done Tuesday for PFT today.  Pt denies any current symptoms.  Discussed case with clinical supervisor, and we both agree since pt has no symptoms and no known exposures since being tested he is cleared to come in for 4:00 pft.  Pt aware.  Nothing further needed at this time- will close encounter.

## 2020-11-27 NOTE — Progress Notes (Signed)
Full PFT performed today. °

## 2020-12-02 ENCOUNTER — Telehealth: Payer: Self-pay | Admitting: Internal Medicine

## 2020-12-02 NOTE — Progress Notes (Signed)
Called and went over PFT results per Dr Vassie Loll with patient. All questions answered and patient expressed full understanding. Patient stated the albuterol is working good for him at this time. Confirmed with patient scheduled CT for Monday 12/07/20 at [facility] [place] to which patient expressed understanding. Nothing further needed at this time.

## 2020-12-03 NOTE — Telephone Encounter (Signed)
ATC pt. VM box is full. WCB.  

## 2020-12-04 ENCOUNTER — Encounter: Payer: Self-pay | Admitting: Emergency Medicine

## 2020-12-04 NOTE — Telephone Encounter (Signed)
Called and spoke to pt. Pt is requesting a letter stating he is under the care of Dr. Sherene Sires and Dr. Vassie Loll. Pt states this is for government assistance. Letter created. Pt is requesting this be mailed to his home address, this was verified with pt.    Triage, please print and mail letter to patient (letter already created). Can close encounter once complete. Thanks.

## 2020-12-04 NOTE — Telephone Encounter (Signed)
Letter has been printed and placed in the mail.

## 2020-12-07 ENCOUNTER — Other Ambulatory Visit: Payer: Self-pay

## 2020-12-07 ENCOUNTER — Ambulatory Visit (HOSPITAL_COMMUNITY)
Admission: RE | Admit: 2020-12-07 | Discharge: 2020-12-07 | Disposition: A | Discharge status: Home / Self Care | Payer: BC Managed Care – PPO | Source: Ambulatory Visit | Attending: Pulmonary Disease | Admitting: Pulmonary Disease

## 2020-12-07 DIAGNOSIS — R0609 Other forms of dyspnea: Secondary | ICD-10-CM

## 2020-12-07 DIAGNOSIS — R0600 Dyspnea, unspecified: Secondary | ICD-10-CM | POA: Insufficient documentation

## 2020-12-09 NOTE — Progress Notes (Signed)
Called and went over CT and PFT results per Dr Vassie Loll with patient. All questions answered and patient expressed full understanding of results. Scheduled Routine office visit with Dr Vassie Loll at the [place] office for Friday 01/15/2021 at 10am. Patient agreeable to time, date and location. Nothing further needed at this time.

## 2021-01-15 ENCOUNTER — Other Ambulatory Visit: Payer: Self-pay

## 2021-01-15 ENCOUNTER — Encounter: Payer: Self-pay | Admitting: Pulmonary Disease

## 2021-01-15 ENCOUNTER — Ambulatory Visit (INDEPENDENT_AMBULATORY_CARE_PROVIDER_SITE_OTHER): Payer: Medicaid Other | Admitting: Pulmonary Disease

## 2021-01-15 DIAGNOSIS — I10 Essential (primary) hypertension: Secondary | ICD-10-CM | POA: Diagnosis not present

## 2021-01-15 DIAGNOSIS — J8410 Pulmonary fibrosis, unspecified: Secondary | ICD-10-CM | POA: Diagnosis not present

## 2021-01-15 NOTE — Progress Notes (Signed)
   Subjective:    Patient ID: Tim Macdonald, male    DOB: 05-13-1961, 60 y.o.   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   Cardiac w/u in [place] "everything was fine"  On his initial visit 09/2020 with my partner Dr. Sherene Sires, ACE inhibitor were stopped and he was placed on Diovan.  No desaturation was noted on ambulation.  Labs showed normal TSH, normal CK, normal BNP, no leukocytosis or anemia, creatinine 1.31  He had an ED visit 1/8 for sudden onset right arm numbness , COVID and flu test was negative MRI C-spine showed mild multilevel spondylosis with moderate left neuroforaminal stenosis at C3/4 MRI brain showed chronic microvascular changes He was referred to neurology  He complains of persistent shortness of breath.  He drives an 13 wheeler but has not been back to work since his COVID infection 05/2020.  He is applying for disability, still feels weak and unable to drive.  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 Also denies presyncope, palpitations, heartburn, abdominal pain, nausea, vomiting,  diarrhea or change in bowel or urinary habits, dysuria,hematuria, rash, arthralgias, visual complaints, headache, numbness weakness or ataxia.     Objective:   Physical Exam  Gen. Pleasant, obese, in no distress ENT - no lesions, no post nasal drip Neck: No JVD, no thyromegaly, no carotid bruits Lungs: no use of accessory muscles, no dullness to percussion,bibasal rales no rhonchi  Cardiovascular: Rhythm regular, heart sounds  normal, no murmurs or gallops, no peripheral edema Musculoskeletal: No deformities, no cyanosis or clubbing , no tremors        Assessment & Plan:

## 2021-01-15 NOTE — Assessment & Plan Note (Signed)
Slight high today, repeat slightly lower. Asked him to take his medications and check with his PCP

## 2021-01-15 NOTE — Assessment & Plan Note (Signed)
He has very mild restriction on PFTs.  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  This is not UIP and I do not expect this to be progressive.  He does not need oxygen. He will be applying for disability, I explained to him that this would be determined by their own doctors and we will provide his records as needed. We will continue following him every 6 months.  He may benefit from pulmonary rehab but he has too much going on right now and would like to hold off

## 2021-01-15 NOTE — Patient Instructions (Addendum)
  You have mild fibrosis related to covid COpy of MRI report

## 2021-08-16 ENCOUNTER — Ambulatory Visit: Payer: Medicaid Other | Admitting: Pulmonary Disease

## 2021-10-08 ENCOUNTER — Encounter: Payer: Self-pay | Admitting: Pulmonary Disease

## 2021-10-08 ENCOUNTER — Ambulatory Visit (INDEPENDENT_AMBULATORY_CARE_PROVIDER_SITE_OTHER): Payer: Medicaid Other | Admitting: Pulmonary Disease

## 2021-10-08 ENCOUNTER — Other Ambulatory Visit: Payer: Self-pay

## 2021-10-08 DIAGNOSIS — J8410 Pulmonary fibrosis, unspecified: Secondary | ICD-10-CM

## 2021-10-08 NOTE — Assessment & Plan Note (Signed)
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  PFTs showed preserved lung function Check ambulatory saturation today. We will also repeat 1 year follow-up high-resolution CT chest to see if there is any disease progression.  If stable disease this would be consistent with post-COVID fibrosis, on the other hand if there is disease progression then we may have to revisit other causes of ILD

## 2021-10-08 NOTE — Patient Instructions (Signed)
   HRCT chest in feb 2023  Amb sat

## 2021-10-08 NOTE — Progress Notes (Signed)
   Subjective:    Patient ID: Tim Macdonald, male    DOB: 04-Feb-1961, 60 y.o.   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      PMH -hypertension, Cardiac w/u in [place] "everything was fine"  2021 MRI C-spine showed mild multilevel spondylosis with moderate left neuroforaminal stenosis at C3/4 MRI brain showed chronic microvascular changes   Chief Complaint  Patient presents with   Follow-up    Patient feels like breathing has not got better since last visit. States its about the same. Gets short of breath with talking, eating, walking.   32-month follow-up visit. He continues to complain of shortness of breath, no better but no worse either.  His wife is still in the nursing home, her tracheostomy was reversed but she has had other issues.  He has not been working since his illness, his application for disability was turned down twice, he has a Clinical research associate now and is trying again. PCP gave him albuterol nebs and this may have helped a little bit. 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 Also denies presyncope, palpitations, heartburn, abdominal pain, nausea, vomiting, diarrhea or change in bowel or urinary habits, dysuria,hematuria, rash, arthralgias, visual complaints, headache, numbness weakness or ataxia.     Objective:   Physical Exam  Gen. 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

## 2021-12-15 ENCOUNTER — Ambulatory Visit (HOSPITAL_COMMUNITY)
Admission: RE | Admit: 2021-12-15 | Discharge: 2021-12-15 | Disposition: A | Discharge status: Home / Self Care | Payer: Medicaid Other | Source: Ambulatory Visit | Attending: Pulmonary Disease | Admitting: Pulmonary Disease

## 2021-12-15 ENCOUNTER — Other Ambulatory Visit: Payer: Self-pay

## 2021-12-15 DIAGNOSIS — J8410 Pulmonary fibrosis, unspecified: Secondary | ICD-10-CM

## 2022-02-02 IMAGING — MR MR CERVICAL SPINE WO/W CM
5 of 8 series · 19 of 48 positions shown · IV contrast (10 GAD)
Comparison: CT cervical spine from same day.

CLINICAL DATA: Neck pain and right arm numbness.

EXAM:
MRI CERVICAL SPINE WITHOUT AND WITH CONTRAST
TECHNIQUE: Multiplanar and multiecho pulse sequences of the cervical spine, to
include the craniocervical junction and cervicothoracic junction,
were obtained without and with intravenous contrast.
CONTRAST:  10mL GADAVIST GADOBUTROL 1 MMOL/ML IV SOLN

[Series 11: T2 · sagittal · 3.0mm · 0.39mm/px · 3 of 15 slices shown (1 of 2)]
[im 1/15]
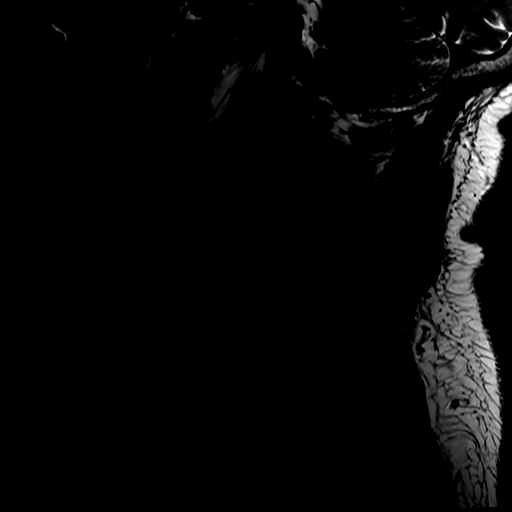
[im 8/15]
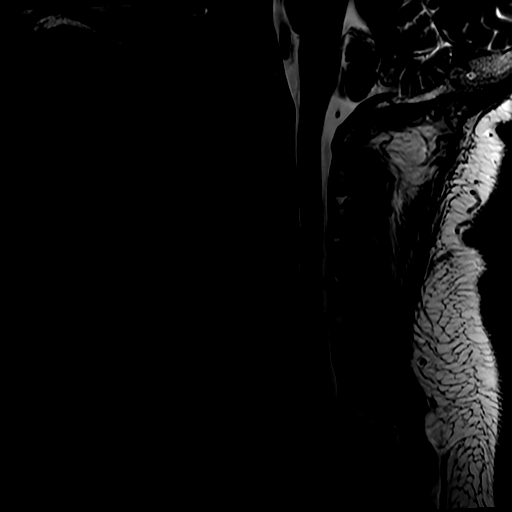
[im 15/15]
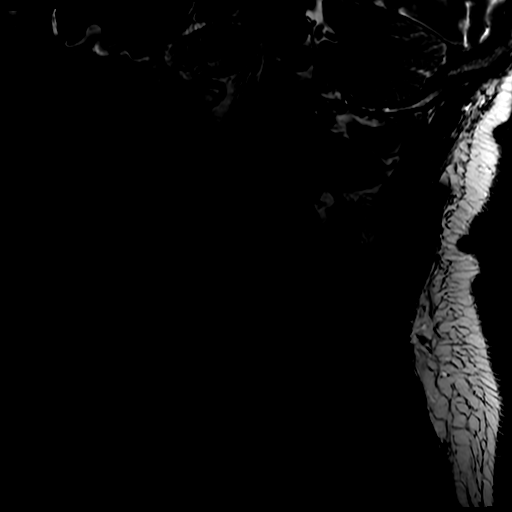

[Series 13: STIR · sagittal · 3.0mm · 0.39mm/px · 1 of 15 slices shown]
[im 1/15]
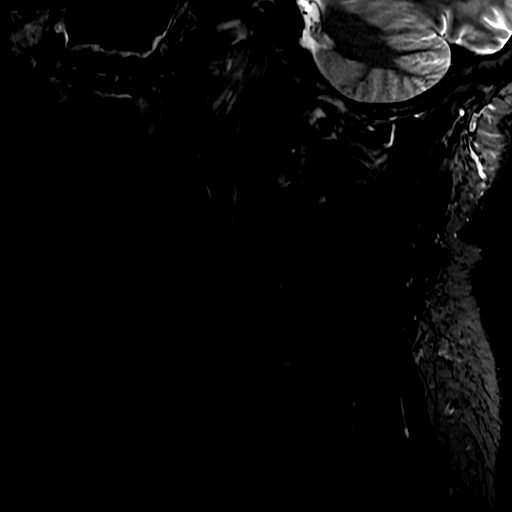

[Series 15: T2 · axial · 3.0mm · 0.35mm/px · z∈[-241,-144]mm · 6 of 32 slices shown (2 of 2)]
[im 1/32]
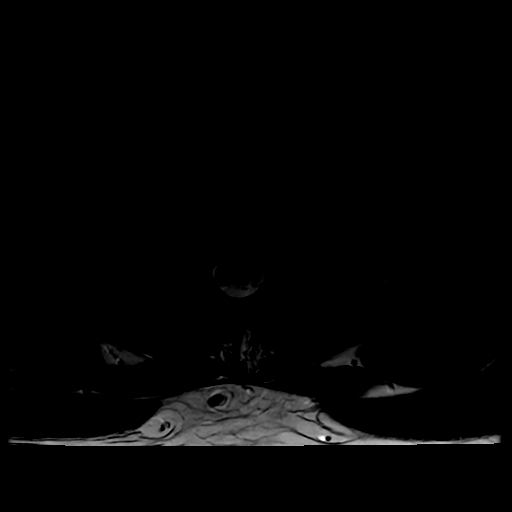
[im 7/32]
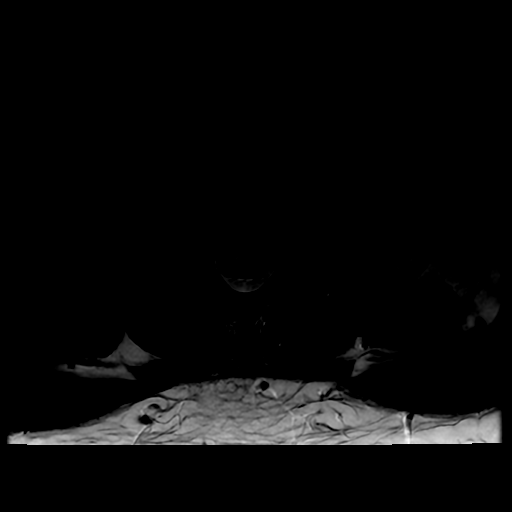
[im 13/32]
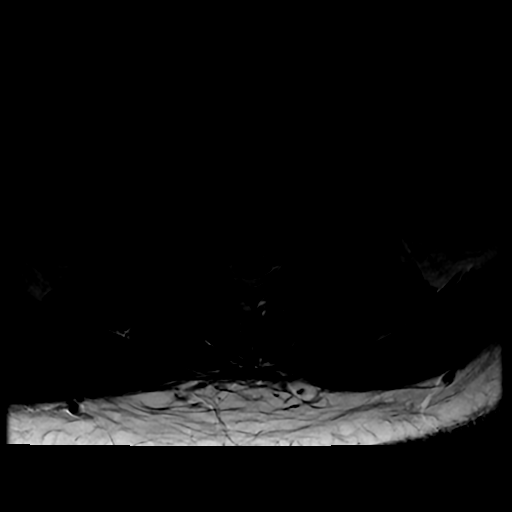
[im 19/32]
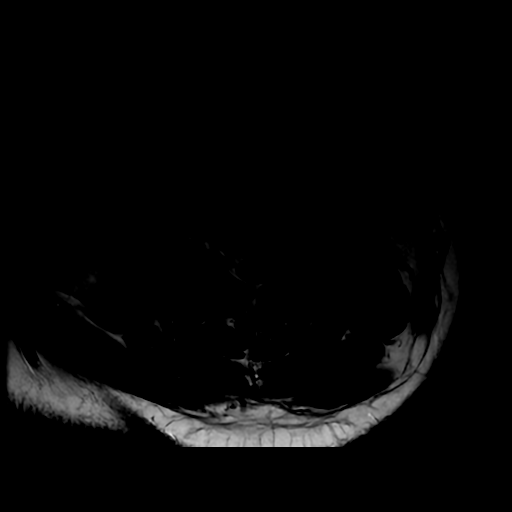
[im 25/32]
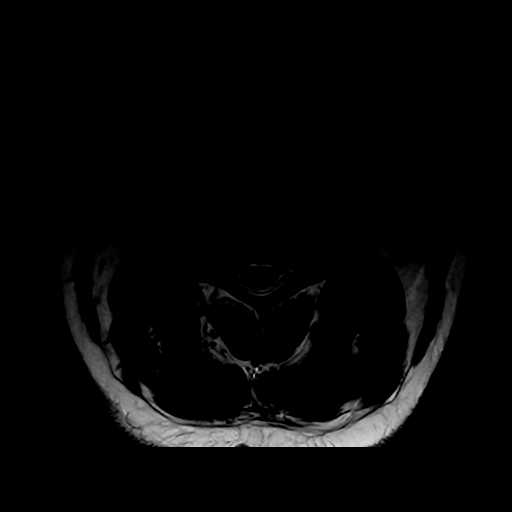
[im 32/32]
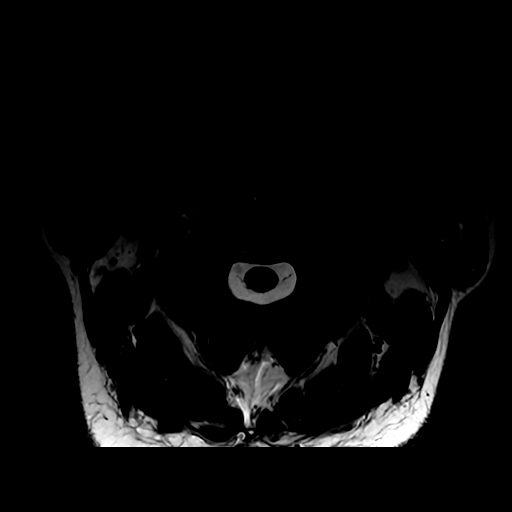

[Series 16: T1 · axial · non-contrast · 3.0mm · 0.35mm/px · z∈[-241,-144]mm · 6 of 32 slices shown]
[im 1/32]
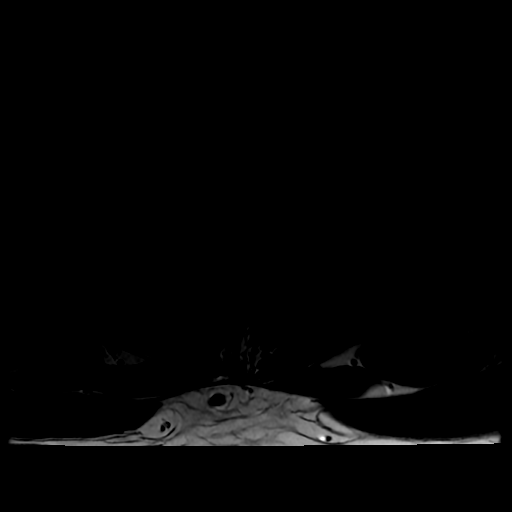
[im 7/32]
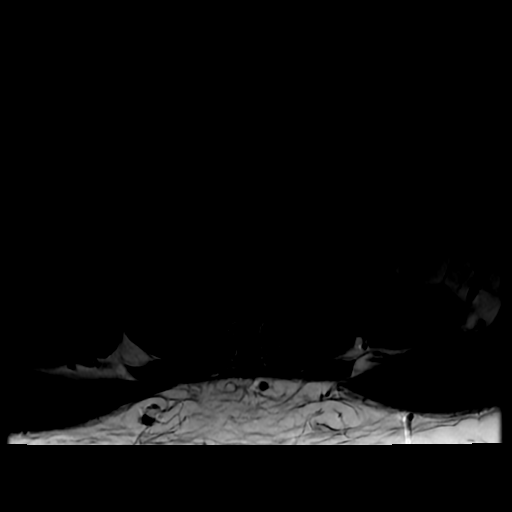
[im 13/32]
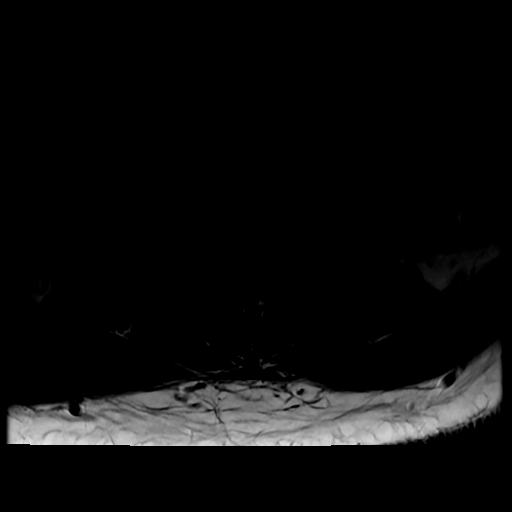
[im 19/32]
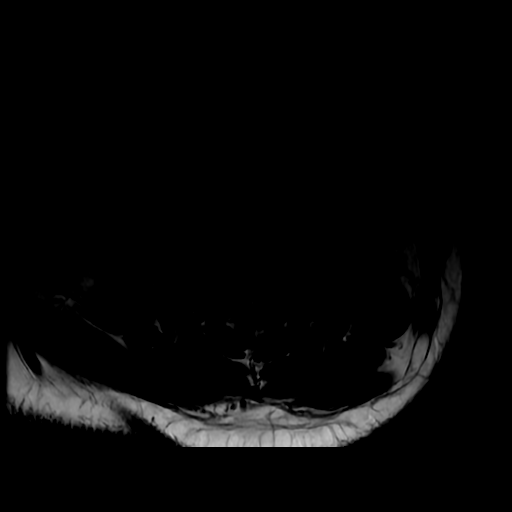
[im 25/32]
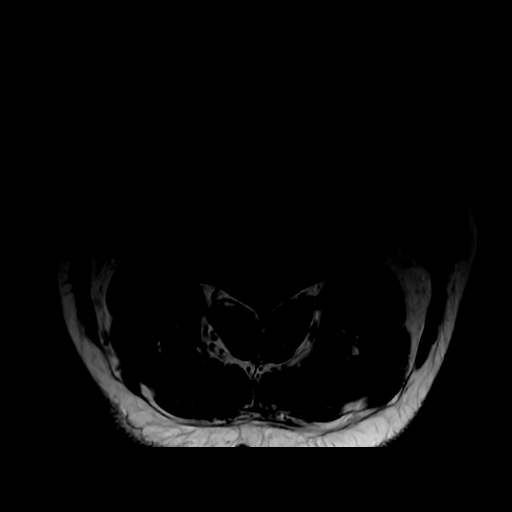
[im 32/32]
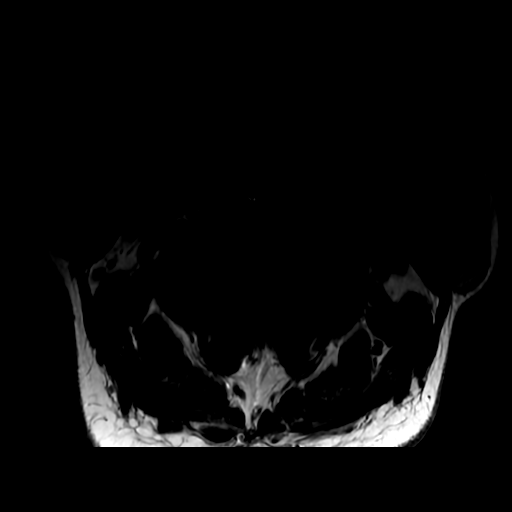

[Series 17: T1 fat-sat post-contrast · sagittal · 3.0mm · 0.39mm/px · 3 of 15 slices shown]
[im 1/15]
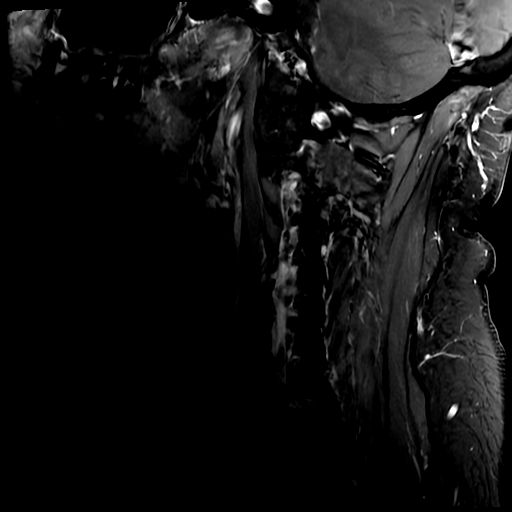
[im 8/15]
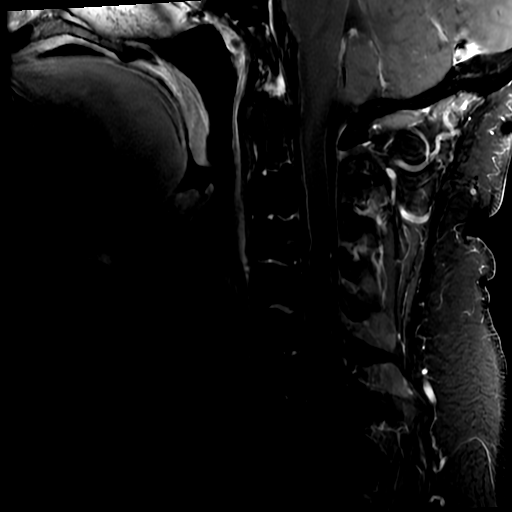
[im 15/15]
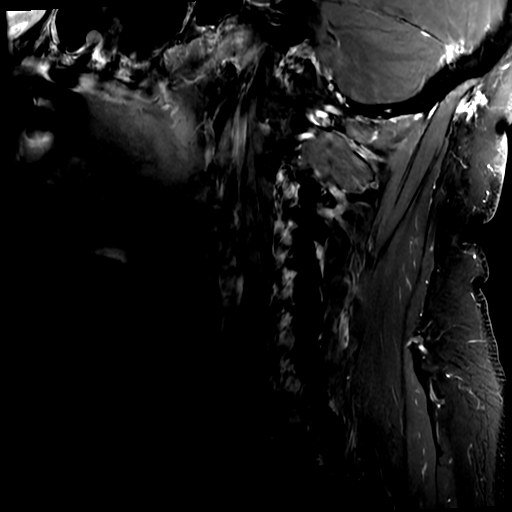

[19 of 48 positions shown; findings below may reference images not displayed]

FINDINGS: Alignment: Slight reversal of the normal cervical lordosis. No
listhesis.

Vertebrae: No fracture, evidence of discitis, or bone lesion.

Cord: Normal signal and morphology.  No intradural enhancement.

Posterior Fossa, vertebral arteries, paraspinal tissues: Negative.

Disc levels:

C2-C3:  Negative.

C3-C4: Mild disc bulging and bilateral uncovertebral hypertrophy.
Moderate left greater than right neuroforaminal stenosis. No spinal
canal stenosis.

C4-C5: Mild disc bulging. Mild left neuroforaminal stenosis. No
spinal canal or right neuroforaminal stenosis.

C5-C6: Small circumferential disc osteophyte complex. Mild bilateral
neuroforaminal stenosis. No spinal canal stenosis.

C6-C7:  Negative.

C7-T1:  Negative.
IMPRESSION: 1. Mild multilevel cervical spondylosis as described above. Moderate
left greater than right neuroforaminal stenosis at C3-C4.

## 2022-04-26 ENCOUNTER — Ambulatory Visit (INDEPENDENT_AMBULATORY_CARE_PROVIDER_SITE_OTHER): Payer: Medicaid Other | Admitting: Pulmonary Disease

## 2022-04-26 ENCOUNTER — Encounter: Payer: Self-pay | Admitting: Pulmonary Disease

## 2022-04-26 VITALS — BP 142/80 | HR 108 | Temp 98.0°F | Ht 70.0 in | Wt 256.2 lb

## 2022-04-26 DIAGNOSIS — J8410 Pulmonary fibrosis, unspecified: Secondary | ICD-10-CM

## 2022-04-26 DIAGNOSIS — R0609 Other forms of dyspnea: Secondary | ICD-10-CM

## 2022-04-28 LAB — ANA: Anti Nuclear Antibody (ANA): NEGATIVE

## 2022-04-28 LAB — CYCLIC CITRUL PEPTIDE ANTIBODY, IGG/IGA: Cyclic Citrullin Peptide Ab: 2 units (ref 0–19)

## 2022-04-28 LAB — SEDIMENTATION RATE: Sed Rate: 14 mm/hr (ref 0–30)

## 2022-08-10 ENCOUNTER — Other Ambulatory Visit: Payer: Self-pay

## 2022-08-10 DIAGNOSIS — R0609 Other forms of dyspnea: Secondary | ICD-10-CM

## 2022-08-30 ENCOUNTER — Encounter (HOSPITAL_COMMUNITY): Payer: Medicaid Other

## 2022-08-31 ENCOUNTER — Ambulatory Visit (HOSPITAL_COMMUNITY)
Admission: RE | Admit: 2022-08-31 | Discharge: 2022-08-31 | Disposition: A | Discharge status: Home / Self Care | Payer: Medicaid Other | Source: Ambulatory Visit | Attending: Pulmonary Disease | Admitting: Pulmonary Disease

## 2022-08-31 DIAGNOSIS — R0609 Other forms of dyspnea: Secondary | ICD-10-CM | POA: Diagnosis not present

## 2022-08-31 LAB — PULMONARY FUNCTION TEST
DL/VA % pred: 102 %
DL/VA: 4.36 ml/min/mmHg/L
DLCO unc % pred: 58 %
DLCO unc: 16.34 ml/min/mmHg
FEF 25-75 Post: 3.32 L/sec
FEF 25-75 Pre: 1.43 L/sec
FEF2575-%Change-Post: 131 %
FEF2575-%Pred-Post: 109 %
FEF2575-%Pred-Pre: 47 %
FEV1-%Change-Post: 43 %
FEV1-%Pred-Post: 79 %
FEV1-%Pred-Pre: 55 %
FEV1-Post: 2.89 L
FEV1-Pre: 2.02 L
FEV1FVC-%Change-Post: 36 %
FEV1FVC-%Pred-Pre: 82 %
FEV6-%Change-Post: 1 %
FEV6-%Pred-Post: 71 %
FEV6-%Pred-Pre: 70 %
FEV6-Post: 3.29 L
FEV6-Pre: 3.23 L
FEV6FVC-%Pred-Post: 104 %
FEV6FVC-%Pred-Pre: 104 %
FVC-%Change-Post: 4 %
FVC-%Pred-Post: 70 %
FVC-%Pred-Pre: 67 %
FVC-Post: 3.38 L
FVC-Pre: 3.23 L
Post FEV1/FVC ratio: 86 %
Post FEV6/FVC ratio: 100 %
Pre FEV1/FVC ratio: 62 %
Pre FEV6/FVC Ratio: 100 %

## 2022-08-31 MED ORDER — ALBUTEROL SULFATE (2.5 MG/3ML) 0.083% IN NEBU
2.5000 mg | INHALATION_SOLUTION | Freq: Once | RESPIRATORY_TRACT | Status: AC
  Administered 2022-08-31: 2.5 mg via RESPIRATORY_TRACT

## 2022-10-18 ENCOUNTER — Ambulatory Visit (INDEPENDENT_AMBULATORY_CARE_PROVIDER_SITE_OTHER): Payer: Medicaid Other | Admitting: Pulmonary Disease

## 2022-10-18 ENCOUNTER — Encounter: Payer: Self-pay | Admitting: Pulmonary Disease

## 2022-10-18 VITALS — BP 138/92 | HR 104 | Temp 97.6°F | Ht 70.0 in | Wt 260.0 lb

## 2022-10-18 DIAGNOSIS — J8410 Pulmonary fibrosis, unspecified: Secondary | ICD-10-CM

## 2022-10-18 DIAGNOSIS — B360 Pityriasis versicolor: Secondary | ICD-10-CM

## 2022-10-18 MED ORDER — KETOCONAZOLE 2 % EX SHAM: 1.0000 | MEDICATED_SHAMPOO | Freq: Every day | CUTANEOUS | 1 refills | Status: DC

## 2022-10-18 NOTE — Patient Instructions (Addendum)
X Amb sat  X HRCT chest in feb 2024  Ketoconazole 2% shampoo for application to skin areas

## 2022-10-18 NOTE — Assessment & Plan Note (Signed)
Attributed to COVID infection. PFTs showed drop in FVC and DLCO..  Prebronchodilator effort was poor.  Not sure if this drop is effort related.  Fortunately does not desaturate on exertion.  Symptoms are stable. He continues to benefit from Trelegy and this will be continued We will revisit with an annual follow-up high-resolution CT scan in February 2024

## 2022-10-18 NOTE — Progress Notes (Signed)
   Subjective:    Patient ID: Tim Macdonald, male    DOB: December 23, 1960, 61 y.o.   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      PMH -hypertension, Cardiac w/u in [place] "everything was fine"  2021 MRI C-spine showed mild multilevel spondylosis with moderate left neuroforaminal stenosis at C3/4 MRI brain showed chronic microvascular changes   Chief Complaint  Patient presents with   Follow-up    Breathing is about the same since last ov  PFT done 11/1   He states that his breathing is stable compared to last visit 6 months ago. His wife has now returned home although she needs assistance with all activities of daily living. He has qualified for disability now and obtain insurance. We reviewed PFTs today. On ambulation saturation stayed at 97% He feels Trelegy helps him the best.  Rarely needs albuterol for rescue. He reports spots over his arms and wonders if he can get any treatment for lab   Significant tests/ events reviewed  09/2021  ambulation >>dyspnea, heart rate went up from 100 to 114 , saturation stayed at 99%    HRCT 12/2021 Moderate centrilobular and paraseptal emphysema. 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 Findings are not significantly changed - c/w post-COVID fibrosis  PFTs 08/2022 prebronchodilator effort was poor, postbronchodilator ratio 86, FEV1 79%, FVC 70%, DLCO 16.3/58%   PFTs 10/2020 ratio 76, FEV1 87%, FVC 90%, TLC 78%, DLCO 80%, mild restriction     Review of Systems neg  for any significant sore throat, dysphagia, itching, sneezing, nasal congestion or excess/ purulent secretions, fever, chills, sweats, unintended wt loss, pleuritic or exertional cp, hempoptysis, orthopnea pnd or change in chronic leg swelling. Also denies presyncope, palpitations, heartburn, abdominal pain, nausea, vomiting, diarrhea or change in bowel or urinary habits, dysuria,hematuria, rash, arthralgias, visual complaints, headache, numbness weakness or ataxia.     Objective:   Physical Exam  Gen. Pleasant, obese, in no distress ENT - no lesions, no post nasal drip Neck: No JVD, no thyromegaly, no carotid bruits Lungs: no use of accessory muscles, no dullness to percussion, decreased without rales or rhonchi  Cardiovascular: Rhythm regular, heart sounds  normal, no murmurs or gallops, no peripheral edema Musculoskeletal: No deformities, no cyanosis or clubbing , no tremors Skin -discoloration/spots lower left arm, few over right, healed lesions on his back, minimal scaling      Assessment & Plan:   Tinea versicolor infection -ketoconazole 2% shampoo given for local application

## 2022-12-05 ENCOUNTER — Ambulatory Visit (HOSPITAL_COMMUNITY)
Admission: RE | Admit: 2022-12-05 | Discharge: 2022-12-05 | Disposition: A | Discharge status: Home / Self Care | Payer: Medicare Other | Source: Ambulatory Visit | Attending: Pulmonary Disease | Admitting: Pulmonary Disease

## 2022-12-05 DIAGNOSIS — J8410 Pulmonary fibrosis, unspecified: Secondary | ICD-10-CM | POA: Insufficient documentation

## 2023-02-02 ENCOUNTER — Ambulatory Visit: Payer: Medicare HMO | Admitting: Pulmonary Disease

## 2023-02-02 ENCOUNTER — Encounter: Payer: Self-pay | Admitting: Pulmonary Disease

## 2023-02-02 VITALS — BP 146/88 | HR 102 | Ht 70.0 in | Wt 259.8 lb

## 2023-02-02 DIAGNOSIS — B360 Pityriasis versicolor: Secondary | ICD-10-CM

## 2023-02-02 DIAGNOSIS — J8410 Pulmonary fibrosis, unspecified: Secondary | ICD-10-CM | POA: Diagnosis not present

## 2023-02-02 NOTE — Progress Notes (Signed)
   Subjective:    Patient ID: Tim Macdonald, male    DOB: October 09, 1961, 62 y.o.   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 -quit smoking 05/2020      PMH -hypertension, Cardiac w/u in [place] "everything was fine"  2021 MRI C-spine showed mild multilevel spondylosis with moderate left neuroforaminal stenosis at C3/4 MRI brain showed chronic microvascular changes  Chief Complaint  Patient presents with   Follow-up    CT results    On his last visit in 09/2022 we noted drop in PFTs in FVC and DLCO but on ambulation saturation stayed at 97% Tim Macdonald states that his breathing is stable, he still feels short of breath when walking up an incline.  He has no interim chest colds.  Blood pressure slight high today and mild tachycardia on walking to the room.  Weight is stable at 259 pounds We reviewed CT results He shows me a Facebook and about a medilisk spray cleanser for the lungs and asked me whether this would help him   Significant tests/ events reviewed  09/2021  ambulation >>dyspnea, heart rate went up from 100 to 114 , saturation stayed at 99%   HRCT 12/2022 >> " alternate" pattern, fibrotic NSIP , unchanged   HRCT 12/2021 Moderate centrilobular and paraseptal emphysema. basilar predominant fibrotic interstitial lung disease with small foci of honeycombing - stable since 12/2020    HRCT 12/2020  mild bibasilar predominant fibrosis without  honeycombing. 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 Also denies presyncope, palpitations, heartburn, abdominal pain, nausea, vomiting, diarrhea or change in bowel or urinary habits, dysuria,hematuria, rash, arthralgias, visual complaints, headache, numbness weakness or ataxia.     Objective:   Physical Exam  Gen. Pleasant, obese, in no distress ENT - no lesions, no post nasal drip Neck: No JVD, no thyromegaly, no carotid bruits Lungs: no use of accessory muscles, no dullness to percussion, right basal fine crackles, no rhonchi Cardiovascular: Rhythm regular, heart sounds  normal, no murmurs or gallops, no peripheral edema Musculoskeletal: No deformities, no cyanosis or clubbing , no tremors       Assessment & Plan:

## 2023-02-02 NOTE — Assessment & Plan Note (Signed)
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  This is nonprogressive over the past 3 years.  PFTs did show a drop last year but symptomatically unchanged.  We will repeat PFTs in 6 months. He does not desaturate on exertion which is reassuring. We have continued Trelegy even though he did not have significant airway obstruction on PFTs due to symptomatic benefit

## 2023-02-02 NOTE — Patient Instructions (Signed)
Fibrosis is stable on scan which is good news.  x PFTs in 6 months

## 2023-02-10 DIAGNOSIS — I10 Essential (primary) hypertension: Secondary | ICD-10-CM | POA: Diagnosis not present

## 2023-02-10 DIAGNOSIS — Z0000 Encounter for general adult medical examination without abnormal findings: Secondary | ICD-10-CM | POA: Diagnosis not present

## 2023-02-10 DIAGNOSIS — Z299 Encounter for prophylactic measures, unspecified: Secondary | ICD-10-CM | POA: Diagnosis not present

## 2023-02-10 DIAGNOSIS — R5383 Other fatigue: Secondary | ICD-10-CM | POA: Diagnosis not present

## 2023-02-10 DIAGNOSIS — E7800 Pure hypercholesterolemia, unspecified: Secondary | ICD-10-CM | POA: Diagnosis not present

## 2023-02-10 DIAGNOSIS — Z79899 Other long term (current) drug therapy: Secondary | ICD-10-CM | POA: Diagnosis not present

## 2023-03-23 DIAGNOSIS — M47816 Spondylosis without myelopathy or radiculopathy, lumbar region: Secondary | ICD-10-CM | POA: Diagnosis not present

## 2023-03-23 DIAGNOSIS — M5416 Radiculopathy, lumbar region: Secondary | ICD-10-CM | POA: Diagnosis not present

## 2023-04-18 ENCOUNTER — Ambulatory Visit (HOSPITAL_COMMUNITY)
Admission: RE | Admit: 2023-04-18 | Discharge: 2023-04-18 | Disposition: A | Discharge status: Home / Self Care | Payer: Medicare (Managed Care) | Source: Ambulatory Visit | Attending: Pulmonary Disease | Admitting: Pulmonary Disease

## 2023-04-18 DIAGNOSIS — J8410 Pulmonary fibrosis, unspecified: Secondary | ICD-10-CM | POA: Insufficient documentation

## 2023-04-18 LAB — PULMONARY FUNCTION TEST
DL/VA % pred: 93 %
DL/VA: 3.94 ml/min/mmHg/L
DLCO unc % pred: 59 %
DLCO unc: 16.36 ml/min/mmHg
FEF 25-75 Post: 3.58 L/sec
FEF 25-75 Pre: 3.15 L/sec
FEF2575-%Change-Post: 13 %
FEF2575-%Pred-Post: 120 %
FEF2575-%Pred-Pre: 105 %
FEV1-%Change-Post: 1 %
FEV1-%Pred-Post: 81 %
FEV1-%Pred-Pre: 80 %
FEV1-Post: 2.95 L
FEV1-Pre: 2.9 L
FEV1FVC-%Change-Post: 2 %
FEV1FVC-%Pred-Pre: 106 %
FEV6-%Change-Post: 0 %
FEV6-%Pred-Post: 77 %
FEV6-%Pred-Pre: 77 %
FEV6-Post: 3.52 L
FEV6-Pre: 3.51 L
FEV6FVC-%Change-Post: 1 %
FEV6FVC-%Pred-Post: 103 %
FEV6FVC-%Pred-Pre: 102 %
FVC-%Change-Post: 0 %
FVC-%Pred-Post: 74 %
FVC-%Pred-Pre: 75 %
FVC-Post: 3.56 L
FVC-Pre: 3.59 L
Post FEV1/FVC ratio: 83 %
Post FEV6/FVC ratio: 99 %
Pre FEV1/FVC ratio: 81 %
Pre FEV6/FVC Ratio: 98 %
RV % pred: 105 %
RV: 2.38 L
TLC % pred: 83 %
TLC: 5.87 L

## 2023-04-18 MED ORDER — ALBUTEROL SULFATE (2.5 MG/3ML) 0.083% IN NEBU
2.5000 mg | INHALATION_SOLUTION | Freq: Once | RESPIRATORY_TRACT | Status: AC
  Administered 2023-04-18: 2.5 mg via RESPIRATORY_TRACT

## 2023-07-29 DIAGNOSIS — H524 Presbyopia: Secondary | ICD-10-CM | POA: Diagnosis not present

## 2023-08-10 DIAGNOSIS — I700 Atherosclerosis of aorta: Secondary | ICD-10-CM | POA: Diagnosis not present

## 2023-08-10 DIAGNOSIS — Z299 Encounter for prophylactic measures, unspecified: Secondary | ICD-10-CM | POA: Diagnosis not present

## 2023-08-10 DIAGNOSIS — Z1331 Encounter for screening for depression: Secondary | ICD-10-CM | POA: Diagnosis not present

## 2023-08-10 DIAGNOSIS — Z7189 Other specified counseling: Secondary | ICD-10-CM | POA: Diagnosis not present

## 2023-08-10 DIAGNOSIS — Z79899 Other long term (current) drug therapy: Secondary | ICD-10-CM | POA: Diagnosis not present

## 2023-08-10 DIAGNOSIS — Z0000 Encounter for general adult medical examination without abnormal findings: Secondary | ICD-10-CM | POA: Diagnosis not present

## 2023-08-10 DIAGNOSIS — R5383 Other fatigue: Secondary | ICD-10-CM | POA: Diagnosis not present

## 2023-08-10 DIAGNOSIS — E7800 Pure hypercholesterolemia, unspecified: Secondary | ICD-10-CM | POA: Diagnosis not present

## 2023-08-10 DIAGNOSIS — Z23 Encounter for immunization: Secondary | ICD-10-CM | POA: Diagnosis not present

## 2023-08-10 DIAGNOSIS — I10 Essential (primary) hypertension: Secondary | ICD-10-CM | POA: Diagnosis not present

## 2023-08-10 DIAGNOSIS — Z1339 Encounter for screening examination for other mental health and behavioral disorders: Secondary | ICD-10-CM | POA: Diagnosis not present

## 2023-08-10 DIAGNOSIS — Z125 Encounter for screening for malignant neoplasm of prostate: Secondary | ICD-10-CM | POA: Diagnosis not present

## 2023-09-11 DIAGNOSIS — I10 Essential (primary) hypertension: Secondary | ICD-10-CM | POA: Diagnosis not present

## 2023-09-11 DIAGNOSIS — J479 Bronchiectasis, uncomplicated: Secondary | ICD-10-CM | POA: Diagnosis not present

## 2023-09-11 DIAGNOSIS — I700 Atherosclerosis of aorta: Secondary | ICD-10-CM | POA: Diagnosis not present

## 2023-09-11 DIAGNOSIS — R52 Pain, unspecified: Secondary | ICD-10-CM | POA: Diagnosis not present

## 2023-09-11 DIAGNOSIS — M545 Low back pain, unspecified: Secondary | ICD-10-CM | POA: Diagnosis not present

## 2023-09-11 DIAGNOSIS — J439 Emphysema, unspecified: Secondary | ICD-10-CM | POA: Diagnosis not present

## 2023-09-11 DIAGNOSIS — Z299 Encounter for prophylactic measures, unspecified: Secondary | ICD-10-CM | POA: Diagnosis not present

## 2023-11-27 DIAGNOSIS — R110 Nausea: Secondary | ICD-10-CM | POA: Diagnosis not present

## 2023-11-27 DIAGNOSIS — E7800 Pure hypercholesterolemia, unspecified: Secondary | ICD-10-CM | POA: Diagnosis not present

## 2023-11-27 DIAGNOSIS — K529 Noninfective gastroenteritis and colitis, unspecified: Secondary | ICD-10-CM | POA: Diagnosis not present

## 2023-11-27 DIAGNOSIS — K219 Gastro-esophageal reflux disease without esophagitis: Secondary | ICD-10-CM | POA: Diagnosis not present

## 2023-11-27 DIAGNOSIS — Z7982 Long term (current) use of aspirin: Secondary | ICD-10-CM | POA: Diagnosis not present

## 2023-11-27 DIAGNOSIS — Z87891 Personal history of nicotine dependence: Secondary | ICD-10-CM | POA: Diagnosis not present

## 2023-11-27 DIAGNOSIS — I10 Essential (primary) hypertension: Secondary | ICD-10-CM | POA: Diagnosis not present

## 2023-11-29 ENCOUNTER — Ambulatory Visit (HOSPITAL_BASED_OUTPATIENT_CLINIC_OR_DEPARTMENT_OTHER): Payer: Medicare HMO | Admitting: Pulmonary Disease

## 2023-11-29 ENCOUNTER — Encounter (HOSPITAL_BASED_OUTPATIENT_CLINIC_OR_DEPARTMENT_OTHER): Payer: Self-pay | Admitting: Pulmonary Disease

## 2023-11-29 VITALS — BP 116/70 | HR 107 | Temp 98.5°F | Ht 70.0 in | Wt 250.2 lb

## 2023-11-29 DIAGNOSIS — J849 Interstitial pulmonary disease, unspecified: Secondary | ICD-10-CM

## 2023-11-29 NOTE — Patient Instructions (Addendum)
Referral to pulmonary rehab program in [place]  CT scan in 6 months  Refills on Trelegy as needed

## 2023-11-29 NOTE — Progress Notes (Signed)
Subjective:    Patient ID: Tim Macdonald, male    DOB: 28-Aug-1961, 63 y.o.   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 -quit smoking 05/2020   -stable fibrotic NSIP pattern on CT scan which would be consistent with post-COVID fibrosis. This is nonprogressive since 2021    PMH -hypertension, Cardiac w/u in [place] "everything was fine"  2021 MRI C-spine showed mild multilevel spondylosis with moderate left neuroforaminal stenosis at C3/4 MRI brain showed chronic microvascular changes  Discussed the use of AI scribe software for clinical note transcription with the patient, who gave verbal consent to proceed.  History of Present Illness   The patient, with a history of interstitial lung disease (ILD) and post COVID fibrosis, reports feeling winded after physical activity. Despite this, his condition has remained stable for the past three years, with consistent pulmonary function tests (PFTs) as of June of the previous year. He has been managing his symptoms with Trelegy and albuterol, both of which he finds helpful. The patient has not participated in a lung rehabilitation program but expresses interest in doing so. He also reports recent episodes of vomiting, which may be due to a stomach bug. 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       Significant tests/ events reviewed   09/2021  ambulation >>dyspnea, heart rate went up from 100 to 114 , saturation stayed at 99%   HRCT 12/2022 >> " alternate" pattern, fibrotic NSIP , unchanged    HRCT 12/2021 Moderate centrilobular and paraseptal emphysema. basilar predominant fibrotic interstitial lung disease with small foci of honeycombing - stable since 12/2020    HRCT 12/2020  mild bibasilar predominant fibrosis without  honeycombing. 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 Also denies presyncope, palpitations, heartburn, abdominal pain, nausea, vomiting, diarrhea or change in bowel or urinary habits, dysuria,hematuria, rash, arthralgias, visual complaints, headache, numbness weakness or ataxia.     Objective:   Physical Exam  Gen. Pleasant, obese, in no distress ENT - no lesions, no post nasal drip Neck: No JVD, no thyromegaly, no carotid bruits Lungs: no use of accessory muscles, no dullness to percussion, BLL rales no rhonchi  Cardiovascular: Rhythm regular, heart sounds  normal, no murmurs or gallops, no peripheral edema Musculoskeletal: No deformities, no cyanosis or clubbing , no tremors        Assessment & Plan:    Assessment and Plan    Interstitial Lung Disease (ILD) ILD well-managed for three years with consistent post-COVID fibrosis pattern on CT scan. Stable PFTs as of June 2024. Reports persistent dyspnea on exertion, managed with Trelegy and albuterol. Discussed pulmonary rehabilitation to improve conditioning and reduce dyspnea, noting it will not reverse lung damage but may enhance exercise  tolerance. - Continue Trelegy - Continue albuterol as needed - Refer to pulmonary rehabilitation program - Repeat HRCT scan in six months  General Health Maintenance Up to  date on flu vaccination. Discussed new RSV vaccine for individuals over 60, especially those with lung disease. Explained RSV is a respiratory virus affecting older adults and those with lung disease. Vaccine is well tolerated and effective for at least two years. - Recommend RSV vaccine, available at pharmacy  Follow-up - Schedule follow-up visit in six months.

## 2024-01-01 DIAGNOSIS — R52 Pain, unspecified: Secondary | ICD-10-CM | POA: Diagnosis not present

## 2024-01-01 DIAGNOSIS — J449 Chronic obstructive pulmonary disease, unspecified: Secondary | ICD-10-CM | POA: Diagnosis not present

## 2024-01-01 DIAGNOSIS — Z299 Encounter for prophylactic measures, unspecified: Secondary | ICD-10-CM | POA: Diagnosis not present

## 2024-01-01 DIAGNOSIS — M545 Low back pain, unspecified: Secondary | ICD-10-CM | POA: Diagnosis not present

## 2024-01-01 DIAGNOSIS — I10 Essential (primary) hypertension: Secondary | ICD-10-CM | POA: Diagnosis not present

## 2024-01-01 DIAGNOSIS — I700 Atherosclerosis of aorta: Secondary | ICD-10-CM | POA: Diagnosis not present

## 2024-01-01 DIAGNOSIS — Z0000 Encounter for general adult medical examination without abnormal findings: Secondary | ICD-10-CM | POA: Diagnosis not present

## 2024-05-10 ENCOUNTER — Ambulatory Visit (HOSPITAL_COMMUNITY)

## 2024-06-03 ENCOUNTER — Ambulatory Visit (HOSPITAL_COMMUNITY)
Admission: RE | Admit: 2024-06-03 | Discharge: 2024-06-03 | Disposition: A | Discharge status: Home / Self Care | Source: Ambulatory Visit | Attending: Pulmonary Disease | Admitting: Pulmonary Disease

## 2024-06-03 DIAGNOSIS — J849 Interstitial pulmonary disease, unspecified: Secondary | ICD-10-CM | POA: Diagnosis not present

## 2024-06-03 DIAGNOSIS — J439 Emphysema, unspecified: Secondary | ICD-10-CM | POA: Diagnosis not present

## 2024-06-16 ENCOUNTER — Ambulatory Visit: Payer: Self-pay | Admitting: Pulmonary Disease

## 2024-06-16 DIAGNOSIS — R0609 Other forms of dyspnea: Secondary | ICD-10-CM

## 2024-06-17 NOTE — Progress Notes (Signed)
 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 Sending thru mychart

## 2024-07-18 ENCOUNTER — Ambulatory Visit (HOSPITAL_BASED_OUTPATIENT_CLINIC_OR_DEPARTMENT_OTHER): Admitting: Pulmonary Disease

## 2024-07-18 ENCOUNTER — Encounter (HOSPITAL_BASED_OUTPATIENT_CLINIC_OR_DEPARTMENT_OTHER): Payer: Self-pay | Admitting: Pulmonary Disease

## 2024-07-18 VITALS — BP 135/82 | HR 99 | Ht 70.0 in | Wt 252.9 lb

## 2024-07-18 DIAGNOSIS — Z23 Encounter for immunization: Secondary | ICD-10-CM | POA: Diagnosis not present

## 2024-07-18 DIAGNOSIS — J439 Emphysema, unspecified: Secondary | ICD-10-CM | POA: Diagnosis not present

## 2024-07-18 DIAGNOSIS — F17210 Nicotine dependence, cigarettes, uncomplicated: Secondary | ICD-10-CM

## 2024-07-18 DIAGNOSIS — J8410 Pulmonary fibrosis, unspecified: Secondary | ICD-10-CM

## 2024-07-18 DIAGNOSIS — J849 Interstitial pulmonary disease, unspecified: Secondary | ICD-10-CM

## 2024-07-18 NOTE — Patient Instructions (Signed)
 X Ambulatory sat  X PFT  X flu shot   Things to know about Nintedanib Capsules What is this medication? NINTEDANIB (nin TED a nib) treats conditions that cause the lungs to scar and stiffen, making it hard to breathe. It works by preventing changes to the lung tissue. This delays worsening symptoms. This medicine may be used for other purposes; ask your health care provider or pharmacist if you have questions. COMMON BRAND NAME(S): Ofev What should I tell my care team before I take this medication? They need to know if you have any of these conditions: Bleeding disorders or family history of bleeding disorders Heart disease History of blood clots Liver disease Protein in your urine Recent surgery Stomach or intestinal problems Tobacco use An unusual or allergic reaction to nintedanib, other medications, foods, dyes, or preservatives Pregnant or trying to get pregnant Breastfeeding How should I use this medication? Take this medication by mouth with water. Take it as directed on the prescription label at the same time every day. Do not cut, crush, or chew this medicine. Swallow the capsules whole. Take it with food. Keep taking it unless your care team tells you to stop. If you touch the content of the capsule by accident, wash your hands right away. Talk to your care team about the use of this medication in children. Special care may be needed. Overdosage: If you think you have taken too much of this medicine contact a poison control center or emergency room at once. NOTE: This medicine is only for you. Do not share this medicine with others. What if I miss a dose? If you miss a dose, skip it. Take your next dose at the normal time. Do not take extra or 2 doses at the same time to make up for the missed dose. What may interact with this medication? Aspirin and aspirin-like medications Certain antibiotics, such as erythromycin or clarithromycin Certain medications for seizures,  such as carbamazepine, phenobarbital, or phenytoin Certain medications that treat or prevent blood clots, such as warfarin, enoxaparin, dalteparin, apixaban, dabigatran, and rivaroxaban Ketoconazole  Rifampin St. John's Wort This list may not describe all possible interactions. Give your health care provider a list of all the medicines, herbs, non-prescription drugs, or dietary supplements you use. Also tell them if you smoke, drink alcohol , or use illegal drugs. Some items may interact with your medicine. What should I watch for while using this medication? Visit your care team for regular checks on your progress. Tell your care team if your symptoms do not start to get better or if they get worse. You may need blood work done while you are taking this medication. Drink plenty of fluids while taking this medication. Check with your care team if you have diarrhea, nausea, and vomiting. The loss of too much body fluid may make it dangerous for you to take this medication. If you smoke, tell your care team if you notice this medication is not working well for you. Talk to your care team if you decide to stop smoking. This medication may increase your risk to bruise or bleed. Call your care team if you notice any unusual bleeding. Talk to your care team if you wish to become pregnant or think you might be pregnant. This medication can cause serious birth defects if taken during pregnancy and for 3 months after stopping it. A negative pregnancy test is required before starting this medication. If you become pregnant, miss a menstrual cycle, or stop using contraception, stop  taking this medication. Call your care team. What side effects may I notice from receiving this medication? Side effects that you should report to your care team as soon as possible: Allergic reactions--skin rash, itching, hives, swelling of the face, lips, tongue, or throat Bleeding--bloody or black, tar-like stools, vomiting blood or  brown material that looks like coffee grounds, red or dark brown urine, small red or purple spots on skin, unusual bruising or bleeding Heart attack--pain or tightness in the chest, shoulders, arms, or jaw, nausea, shortness of breath, cold or clammy skin, feeling faint or lightheaded Kidney injury--decrease in the amount of urine, swelling of the ankles, hands, or feet Liver injury--right upper belly pain, loss of appetite, nausea, light-colored stool, dark yellow or brown urine, yellowing skin or eyes, unusual weakness or fatigue Stomach pain that is severe, does not go away, or gets worse Side effects that usually do not require medical attention (report to your care team if they continue or are bothersome): Diarrhea Headache Loss of appetite with weight loss Nausea Stomach pain Vomiting This list may not describe all possible side effects. Call your doctor for medical advice about side effects. You may report side effects to FDA at 1-800-FDA-1088. Where should I keep my medication? Keep out of the reach of children and pets. Store at room temperature between 20 and 25 degrees C (68 and 77 degrees F). Avoid exposure to extreme heat. Protect from moisture. Keep the container tightly closed. Get rid of any unused medication after the expiration date. To get rid of medications that are no longer needed or have expired: Take the medication to a medication take-back program. Check with your pharmacy or law enforcement to find a location. If you cannot return the medication, check the label or package insert to see if the medication should be thrown out in the garbage or flushed down the toilet. If you are not sure, ask your care team. If it is safe to put it in the trash, empty the medication out of the container. Mix the medication with cat litter, dirt, coffee grounds, or other unwanted substance. Seal the mixture in a bag or container. Put it in the trash. NOTE: This sheet is a summary. It may  not cover all possible information. If you have questions about this medicine, talk to your doctor, pharmacist, or health care provider.  2024 Elsevier/Gold Standard (2021-11-26 00:00:00)

## 2024-07-18 NOTE — Progress Notes (Signed)
 Subjective:    Patient ID: TYREZ BERRIOS, male    DOB: 04-30-1961, 63 y.o.   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 This is nonprogressive since 2021    PMH -hypertension, Cardiac w/u in [place] everything was fine  2021 MRI C-spine showed mild multilevel spondylosis with moderate left neuroforaminal stenosis at C3/4 MRI brain showed chronic microvascular changes  Discussed the use of AI scribe software for clinical note transcription with the patient, who gave verbal consent to proceed.  History of Present Illness  Discussed the use of AI scribe software for clinical note transcription with the patient, who gave verbal consent to proceed.  History of Present Illness   RED MANDT is a 63 year old male with interstitial lung disease who presents for follow-up. He is accompanied by his wife and her mother, who are waiting in the car.  His interstitial lung disease remains stable with no significant changes in symptoms. He experiences shortness of breath and becomes winded with certain activities. A recent scan indicated slight worsening compared to 2022, but no change since 2024.  He has a history of smoking, contributing to emphysema. He is interested in potential treatments, including herbal remedies, to heal lung tissues and prevent further deterioration. He is concerned about the potential need for oxygen therapy in the future.  He is worried about medication side effects, such as diarrhea, light sensitivity, and liver issues, and the requirement for regular liver testing. He has no cold or new respiratory symptoms.       Did not desat on walking  Significant tests/ events reviewed   09/2021  ambulation >>dyspnea, heart rate went up from 100 to 114 , saturation stayed at 99%   HRCT chest  05/2024 >> unchanged from 2024, but worse from 2022, probable UIP + emphysema  HRCT 12/2022 >>  alternate pattern, fibrotic NSIP , unchanged    HRCT 12/2021 Moderate centrilobular and paraseptal emphysema. basilar predominant fibrotic interstitial lung disease with small foci of honeycombing - stable since 12/2020    HRCT 12/2020  mild bibasilar predominant fibrosis without  honeycombing. Findings are not significantly changed - c/w post-COVID fibrosis   CT chest with contrast 07/2020 moderate emphysema Basilar predominant fibrotic ILD without honeycombing, consolidative findings from 05/2020 have resolved   PFTs 04/2023 no airway obstruction, FEV1 80%, FVC 75%, DLCO 16.3/59%, stable   PFTs 08/2022 prebronchodilator effort was poor, postbronchodilator ratio 86, FEV1 79%, FVC 70%, DLCO 16.3/58%   PFTs 10/2020 ratio 76, FEV1 87%, FVC 90%, TLC 78%, DLCO 80%, mild restriction  Review of Systems  neg for any significant sore throat, dysphagia, itching, sneezing, nasal congestion or excess/ purulent secretions, fever, chills, sweats, unintended wt loss, pleuritic or exertional cp, hempoptysis, orthopnea pnd or change in chronic leg swelling. Also denies presyncope, palpitations, heartburn, abdominal pain, nausea, vomiting, diarrhea or change in bowel or urinary habits, dysuria,hematuria, rash, arthralgias, visual complaints, headache, numbness weakness or ataxia.      Objective:   Physical Exam  Gen. Pleasant, obese, in no distress ENT - no lesions, no post nasal drip Neck: No JVD, no thyromegaly, no carotid bruits Lungs: no use of accessory muscles, no dullness to percussion, BB dry  rales no rhonchi  Cardiovascular: Rhythm regular, heart sounds  normal, no murmurs or gallops, no peripheral edema  Musculoskeletal: No deformities, no cyanosis or clubbing , no tremors       Assessment & Plan:   Assessment and Plan Assessment & Plan  Assessment and Plan    Interstitial lung disease with  pulmonary fibrosis Interstitial lung disease with pulmonary fibrosis, initially attributed to post-COVID fibrosis, shows slight worsening since 2022 but is unchanged from 2024. This raises concerns about idiopathic pulmonary fibrosis. Symptoms include persistent dyspnea on exertion. Antifibrotic medications were discussed, which may slow disease progression but will not reverse existing fibrosis. He is concerned about side effects, including diarrhea, photosensitivity, and liver function impact, and is hesitant about starting treatment. - Perform pulmonary function test (PFT) to assess current lung function and compare with previous results. - Conduct a walking test to evaluate exercise tolerance. - If PFT shows decline, initiate process for insurance approval of antifibrotic medication. - Discuss potential antifibrotic medications, including side effects and administration frequency. - Monitor liver function monthly if antifibrotic medication is initiated.  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  Goals of Care He desires to avoid progression to requiring oxygen therapy and is concerned about the potential side effects of antifibrotic medications on his quality of life. He prefers to avoid medications causing photosensitivity and is wary of those affecting liver function.

## 2024-08-12 DIAGNOSIS — Z299 Encounter for prophylactic measures, unspecified: Secondary | ICD-10-CM | POA: Diagnosis not present

## 2024-08-12 DIAGNOSIS — Z0000 Encounter for general adult medical examination without abnormal findings: Secondary | ICD-10-CM | POA: Diagnosis not present

## 2024-08-12 DIAGNOSIS — Z7189 Other specified counseling: Secondary | ICD-10-CM | POA: Diagnosis not present

## 2024-08-12 DIAGNOSIS — Z79899 Other long term (current) drug therapy: Secondary | ICD-10-CM | POA: Diagnosis not present

## 2024-08-12 DIAGNOSIS — I10 Essential (primary) hypertension: Secondary | ICD-10-CM | POA: Diagnosis not present

## 2024-08-12 DIAGNOSIS — E7800 Pure hypercholesterolemia, unspecified: Secondary | ICD-10-CM | POA: Diagnosis not present

## 2024-08-12 DIAGNOSIS — R5383 Other fatigue: Secondary | ICD-10-CM | POA: Diagnosis not present

## 2024-09-05 ENCOUNTER — Ambulatory Visit

## 2024-09-05 ENCOUNTER — Ambulatory Visit: Payer: Self-pay | Admitting: Pulmonary Disease

## 2024-09-05 DIAGNOSIS — J8410 Pulmonary fibrosis, unspecified: Secondary | ICD-10-CM

## 2024-09-05 DIAGNOSIS — J849 Interstitial pulmonary disease, unspecified: Secondary | ICD-10-CM | POA: Diagnosis not present

## 2024-09-05 LAB — PULMONARY FUNCTION TEST
DL/VA % pred: 88 %
DL/VA: 3.73 ml/min/mmHg/L
DLCO unc % pred: 60 %
DLCO unc: 16.71 ml/min/mmHg
FEF 25-75 Pre: 3.39 L/s
FEF2575-%Pred-Pre: 115 %
FEV1-%Pred-Pre: 77 %
FEV1-Pre: 2.78 L
FEV1FVC-%Pred-Pre: 109 %
FEV6-%Pred-Pre: 73 %
FEV6-Pre: 3.34 L
FEV6FVC-%Pred-Pre: 104 %
FVC-%Pred-Pre: 70 %
FVC-Pre: 3.35 L
Pre FEV1/FVC ratio: 83 %
Pre FEV6/FVC Ratio: 100 %

## 2024-09-05 NOTE — Patient Instructions (Signed)
Spiro/DLCO performed today. 

## 2024-09-05 NOTE — Progress Notes (Signed)
Spiro/DLCO performed today. 

## 2024-10-03 ENCOUNTER — Ambulatory Visit (INDEPENDENT_AMBULATORY_CARE_PROVIDER_SITE_OTHER): Admitting: Pulmonary Disease

## 2024-10-03 ENCOUNTER — Encounter (HOSPITAL_BASED_OUTPATIENT_CLINIC_OR_DEPARTMENT_OTHER): Payer: Self-pay | Admitting: Pulmonary Disease

## 2024-10-03 VITALS — BP 136/64 | HR 91 | Ht 70.0 in | Wt 253.8 lb

## 2024-10-03 DIAGNOSIS — I10 Essential (primary) hypertension: Secondary | ICD-10-CM

## 2024-10-03 DIAGNOSIS — J8410 Pulmonary fibrosis, unspecified: Secondary | ICD-10-CM | POA: Diagnosis not present

## 2024-10-03 DIAGNOSIS — J849 Interstitial pulmonary disease, unspecified: Secondary | ICD-10-CM

## 2024-10-03 NOTE — Patient Instructions (Signed)
  VISIT SUMMARY: During your visit, we discussed the progression of your interstitial lung disease, your recent headache, and your hypertension. Your recent CT scans show minimal progression of your lung disease, and your pulmonary function tests remain stable. We also addressed your concerns about lung scarring and discussed monitoring options. Additionally, we talked about your elevated blood pressure and the importance of monitoring it at home.  YOUR PLAN: -PULMONARY FIBROSIS (POST-COVID): Pulmonary fibrosis is a condition where lung tissue becomes damaged and scarred, making it difficult to breathe. Your recent CT scan shows minimal progression, and your lung function tests are stable. We discussed ANTI FIBROTIC med called OFEV & PERFENIDONE . You decided to hold off. We will continue to monitor your condition and have scheduled a follow-up in six months with a repeat CT scan. Please watch for signs of worsening condition, such as persistent cough or increased shortness of breath.  -HYPERTENSION: Hypertension, or high blood pressure, is a condition where the force of the blood against your artery walls is too high. Your blood pressure was elevated today, and you are currently taking amlodipine. It is important to monitor your blood pressure at home daily and keep a log. If your blood pressure remains high, you should contact your primary care physician for a potential adjustment of your medication.  INSTRUCTIONS: Please monitor your blood pressure at home daily and maintain a log. If your blood pressure remains elevated, contact your primary care physician. We have scheduled a follow-up appointment in six months with a repeat CT scan to monitor your lung condition. Watch for signs of worsening lung condition, such as persistent cough or increased shortness of breath.                      Contains text generated by [employer].                                  Contains text generated by [employer].

## 2024-10-03 NOTE — Progress Notes (Signed)
 Subjective:    Patient ID: Tim Macdonald, male    DOB: [birth year], 63 y.o.   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 This is nonprogressive since 2021    PMH -hypertension, Cardiac w/u in [place] everything was fine  2021 MRI C-spine showed mild multilevel spondylosis with moderate left neuroforaminal stenosis at C3/4 MRI brain showed chronic microvascular changes   Discussed the use of AI scribe software for clinical note transcription with the patient, who gave verbal consent to proceed.  History of Present Illness Tim Macdonald is a 63 year old male with interstitial lung disease who presents for follow-up.  Recent CT scans show minimal progression of his interstitial lung disease. Pulmonary function tests are stable with lung capacity at 70% and diffusion capacity at 60%, similar to prior results. He remains winded with exertion but has no new or worsening shortness of breath. He is worried about lung scarring after reading about treatments and wants to discuss options.  He developed a mild headache the day before the visit. Ibuprofen gave brief relief but the pain recurred. He has not had similar headaches before and denies vision changes.  He has intermittent numbness in both legs when standing, which he relates to prior back problems and injections.  He is taking amlodipine for hypertension, which he took a couple of hours before the visit.     Significant tests/ events reviewed   09/2021  ambulation >>dyspnea, heart rate went up from 100 to 114 , saturation stayed at 99%   HRCT chest 05/2024 >> unchanged from 2024, but worse from 2022, probable UIP + emphysema   HRCT 12/2022 >>  alternate pattern, fibrotic NSIP , unchanged    HRCT 12/2021 Moderate centrilobular and paraseptal emphysema.  basilar predominant fibrotic interstitial lung disease with small foci of honeycombing - stable since 12/2020    HRCT 12/2020  mild bibasilar predominant fibrosis without  honeycombing. Findings are not significantly changed - c/w post-COVID fibrosis   CT chest with contrast 07/2020 moderate emphysema Basilar predominant fibrotic ILD without honeycombing, consolidative findings from 05/2020 have resolved    PFTs 08/2024 >>  PFTs 04/2023 no airway obstruction, FEV1 80%, FVC 75%, DLCO 16.3/59%, stable   PFTs 08/2022 prebronchodilator effort was poor, postbronchodilator ratio 86, FEV1 79%, FVC 70%, DLCO 16.3/58%   PFTs 10/2020 ratio 76, FEV1 87%, FVC 90%, TLC 78%, DLCO 80%, mild restriction  Review of Systems  neg for any significant sore throat, dysphagia, itching, sneezing, nasal congestion or excess/ purulent secretions, fever, chills, sweats, unintended wt loss, pleuritic or exertional cp, hempoptysis, orthopnea pnd or change in chronic leg swelling. Also denies presyncope, palpitations, heartburn, abdominal pain, nausea, vomiting, diarrhea or change in bowel or urinary habits, dysuria,hematuria, rash, arthralgias, visual complaints, headache, numbness weakness or ataxia.      Objective:   Physical Exam  Gen. Pleasant, obese, in no distress ENT - no lesions, no post nasal drip Neck: No JVD, no thyromegaly, no carotid bruits Lungs: no use of accessory muscles, no dullness to percussion, BB dry rales no rhonchi  Cardiovascular: Rhythm regular, heart sounds  normal, no murmurs or gallops, no peripheral edema Musculoskeletal: No deformities, no cyanosis or clubbing , no tremors       Assessment & Plan:   Assessment and Plan Assessment & Plan Pulmonary  fibrosis (post-COVID) CT scan shows minimal progression compared to 2022, raising concern for non-COVID fibrosis. Lung function tests remain stable with lung capacity at 75% and diffusion capacity at 60%. Differential diagnosis includes  COVID-related fibrosis versus other progressive fibrosis. He prefers to monitor condition due to concerns about liver function and potential side effects of medication. - Continue to monitor lung function and symptoms. - Scheduled follow-up in six months with repeat CT scan. - Educated on signs of worsening condition, such as persistent cough or increased dyspnea.  Hypertension Blood pressure is elevated today. He is on amlodipine and reports taking it a few hours ago. Discussed the importance of monitoring blood pressure at home and maintaining a log. If blood pressure remains high, a second antihypertensive may be needed. - Monitor blood pressure at home daily and maintain a log. - Contact primary care physician if blood pressure remains elevated for potential adjustment of medication.

## 2024-11-14 ENCOUNTER — Encounter (INDEPENDENT_AMBULATORY_CARE_PROVIDER_SITE_OTHER): Payer: Self-pay | Admitting: *Deleted

## 2024-11-28 ENCOUNTER — Telehealth: Payer: Self-pay | Admitting: *Deleted

## 2024-11-28 NOTE — Telephone Encounter (Signed)
" °  Procedure: COLONOSCOPY  Height: 5'10 Weight: 254lbs        Have you had a colonoscopy before?  Yes 10 years ago at [facility]  Do you have family history of colon cancer?  Yes, sister  Do you have a family history of polyps? yes  Previous colonoscopy with polyps removed? yes  Do you have a history colorectal cancer?   no  Are you diabetic?  no  Do you have a prosthetic or mechanical heart valve? no  Do you have a pacemaker/defibrillator?   no  Have you had endocarditis/atrial fibrillation?  no  Do you use supplemental oxygen/CPAP?  no  Have you had joint replacement within the last 12 months?  no  Do you tend to be constipated or have to use laxatives?  no   Do you have history of alcohol  use? If yes, how much and how often.  no  Do you have history or are you using drugs? If yes, what do are you  using?  no  Have you ever had a stroke/heart attack?  no  Have you ever had a heart or other vascular stent placed,?no  Do you take weight loss medication? no   Do you take any blood-thinning medications such as: (Plavix, aspirin, Coumadin, Aggrenox, Brilinta, Xarelto, Eliquis, Pradaxa, Savaysa or Effient)? no  If yes we need the name, milligram, dosage and who is prescribing doctor:               Current Outpatient Medications  Medication Sig Dispense Refill   albuterol  (VENTOLIN  HFA) 108 (90 Base) MCG/ACT inhaler Inhale 2 puffs into the lungs every 6 (six) hours as needed for wheezing or shortness of breath.     amLODipine (NORVASC) 10 MG tablet Take 10 mg by mouth daily.     aspirin 81 MG chewable tablet Chew 81 mg by mouth daily.     gabapentin (NEURONTIN) 300 MG capsule Take 300 mg by mouth 3 (three) times daily.     omeprazole (PRILOSEC) 20 MG capsule Take 20 mg by mouth daily.     simvastatin (ZOCOR) 40 MG tablet Take 40 mg by mouth daily. Pt only taking 20 mg due to leg cramps     tiZANidine (ZANAFLEX) 4 MG tablet Take 4 mg by mouth at bedtime.     TRELEGY ELLIPTA  200-62.5-25 MCG/ACT AEPB Inhale 1 puff into the lungs daily.     albuterol  (PROVENTIL ) (2.5 MG/3ML) 0.083% nebulizer solution SMARTSIG:1 Vial(s) Via Nebulizer 4 Times Daily PRN     No current facility-administered medications for this visit.    Allergies[1]      [1] No Known Allergies  "
